# Patient Record
Sex: Female | Born: 2000
Health system: Southern US, Community
[De-identification: ages and names within clinical notes are randomized; demographics above are authoritative.]

## PROBLEM LIST (undated history)

## (undated) DIAGNOSIS — J302 Other seasonal allergic rhinitis: Secondary | ICD-10-CM

---
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
## 2022-05-01 ENCOUNTER — Other Ambulatory Visit: Payer: Self-pay

## 2022-05-01 ENCOUNTER — Encounter (HOSPITAL_COMMUNITY): Payer: Self-pay

## 2022-05-01 ENCOUNTER — Emergency Department (HOSPITAL_COMMUNITY): Payer: Medicaid Other

## 2022-05-01 ENCOUNTER — Emergency Department (HOSPITAL_COMMUNITY)
Admission: EM | Admit: 2022-05-01 | Discharge: 2022-05-02 | Disposition: A | Discharge status: Home / Self Care | Payer: Medicaid Other | Attending: Emergency Medicine | Admitting: Emergency Medicine

## 2022-05-01 DIAGNOSIS — Z20822 Contact with and (suspected) exposure to covid-19: Secondary | ICD-10-CM | POA: Insufficient documentation

## 2022-05-01 DIAGNOSIS — B349 Viral infection, unspecified: Secondary | ICD-10-CM | POA: Diagnosis not present

## 2022-05-01 DIAGNOSIS — R1084 Generalized abdominal pain: Secondary | ICD-10-CM | POA: Diagnosis present

## 2022-05-01 DIAGNOSIS — N9489 Other specified conditions associated with female genital organs and menstrual cycle: Secondary | ICD-10-CM | POA: Diagnosis not present

## 2022-05-01 LAB — COMPREHENSIVE METABOLIC PANEL
ALT: 14 U/L (ref 0–44)
AST: 19 U/L (ref 15–41)
Albumin: 4.1 g/dL (ref 3.5–5.0)
Alkaline Phosphatase: 79 U/L (ref 38–126)
Anion gap: 10 (ref 5–15)
BUN: 15 mg/dL (ref 6–20)
CO2: 20 mmol/L — ABNORMAL LOW (ref 22–32)
Calcium: 9.7 mg/dL (ref 8.9–10.3)
Chloride: 107 mmol/L (ref 98–111)
Creatinine, Ser: 1.04 mg/dL — ABNORMAL HIGH (ref 0.44–1.00)
GFR, Estimated: >60 mL/min (ref >60)
Glucose, Bld: 79 mg/dL (ref 70–99)
Potassium: 4.1 mmol/L (ref 3.5–5.1)
Sodium: 137 mmol/L (ref 135–145)
Total Bilirubin: 0.9 mg/dL (ref 0.3–1.2)
Total Protein: 8.4 g/dL — ABNORMAL HIGH (ref 6.5–8.1)

## 2022-05-01 LAB — URINALYSIS, ROUTINE W REFLEX MICROSCOPIC
Bilirubin Urine: NEGATIVE
Glucose, UA: NEGATIVE mg/dL
Hgb urine dipstick: NEGATIVE
Ketones, ur: 20 mg/dL — AB
Leukocytes,Ua: NEGATIVE
Nitrite: NEGATIVE
Protein, ur: NEGATIVE mg/dL
Specific Gravity, Urine: 1.029 (ref 1.005–1.030)
pH: 5 (ref 5.0–8.0)

## 2022-05-01 LAB — I-STAT BETA HCG BLOOD, ED (MC, WL, AP ONLY): I-stat hCG, quantitative: <5 m[IU]/mL (ref <5)

## 2022-05-01 LAB — CBC
HCT: 41.8 % (ref 36.0–46.0)
Hemoglobin: 13.4 g/dL (ref 12.0–15.0)
MCH: 23.7 pg — ABNORMAL LOW (ref 26.0–34.0)
MCHC: 32.1 g/dL (ref 30.0–36.0)
MCV: 74 fL — ABNORMAL LOW (ref 80.0–100.0)
Platelets: 378 10*3/uL (ref 150–400)
RBC: 5.65 MIL/uL — ABNORMAL HIGH (ref 3.87–5.11)
RDW: 14.6 % (ref 11.5–15.5)
WBC: 8.1 10*3/uL (ref 4.0–10.5)
nRBC: 0 % (ref 0.0–0.2)

## 2022-05-01 LAB — LIPASE, BLOOD: Lipase: 28 U/L (ref 11–51)

## 2022-05-01 LAB — RESP PANEL BY RT-PCR (FLU A&B, COVID) ARPGX2
Influenza A by PCR: NEGATIVE
Influenza B by PCR: NEGATIVE
SARS Coronavirus 2 by RT PCR: NEGATIVE

## 2022-05-01 LAB — GROUP A STREP BY PCR: Group A Strep by PCR: NOT DETECTED

## 2022-05-01 NOTE — ED Provider Triage Note (Signed)
Emergency Medicine Provider Triage Evaluation Note ? ?Madison Foster , a 21 y.o. female  was evaluated in triage.  Pt complains of nausea, vomiting x3 NBNB, fatigue, sore throat and SOB ? ?Her sx started yesterday.  ? ?Review of Systems  ?Positive: See above ?Negative: Fever  ? ?Physical Exam  ?BP (!) 122/92   Pulse 74   Temp 98.1 ?F (36.7 ?C)   Resp 16   SpO2 99%  ?Gen:   Awake, no distress   ?Resp:  Normal effort  ?MSK:   Moves extremities without difficulty  ?Other:  Pleasant well-appearing 21 year old.  In no acute distress.  Sitting comfortably in bed.  Able answer questions appropriately follow commands. No increased work of breathing. Speaking in full sentences.  ?Abd soft NTTP. Lungs w coarse lung sounds ? ?Medical Decision Making  ?Medically screening exam initiated at 8:18 PM.  Appropriate orders placed.  Madison [facility] was informed that the remainder of the evaluation will be completed by another provider, this initial triage assessment does not replace that evaluation, and the importance of remaining in the ED until their evaluation is complete. ? ?CXR  ? ? ?Abd labs and urine ?  ?Gailen Shelter, [place] ?05/01/22 2023 ? ?

## 2022-05-01 NOTE — ED Triage Notes (Signed)
Pt reports yesterday she woke up with a sore throat then started to c.o abd pain, vomitting, sob. ?

## 2022-05-02 MED ORDER — ALBUTEROL SULFATE HFA 108 (90 BASE) MCG/ACT IN AERS
2.0000 | INHALATION_SPRAY | Freq: Once | RESPIRATORY_TRACT | Status: AC
  Administered 2022-05-02: 2 via RESPIRATORY_TRACT
  Filled 2022-05-02: qty 6.7

## 2022-05-02 MED ORDER — DICYCLOMINE HCL 10 MG PO CAPS
10.0000 mg | ORAL_CAPSULE | Freq: Once | ORAL | Status: AC
  Administered 2022-05-02: 10 mg via ORAL
  Filled 2022-05-02: qty 1

## 2022-05-02 MED ORDER — ONDANSETRON 4 MG PO TBDP
4.0000 mg | ORAL_TABLET | Freq: Once | ORAL | Status: AC
  Administered 2022-05-02: 4 mg via ORAL
  Filled 2022-05-02: qty 1

## 2022-05-02 MED ORDER — SODIUM CHLORIDE 0.9 % IV BOLUS
1000.0000 mL | Freq: Once | INTRAVENOUS | Status: AC
  Administered 2022-05-02: 1000 mL via INTRAVENOUS

## 2022-05-02 MED ORDER — DICYCLOMINE HCL 20 MG PO TABS: 20.0000 mg | ORAL_TABLET | Freq: Two times a day (BID) | ORAL | 0 refills | Status: DC

## 2022-05-02 MED ORDER — ONDANSETRON 4 MG PO TBDP: 4.0000 mg | ORAL_TABLET | Freq: Three times a day (TID) | ORAL | 0 refills | Status: DC | PRN

## 2022-05-02 NOTE — ED Provider Notes (Addendum)
?[place] ?Provider Note ? ? ?CSN: [id number] ?Arrival date & time: 05/01/22  1821 ? ?  ? ?History ? ?Chief Complaint  ?Patient presents with  ? Abdominal Pain  ? ? ?Madison Foster is a 21 y.o. female. ? ?HPI ?21 year old female presents to the ER with complaints of sore throat, nausea, vomiting and generalized abdominal pain which started yesterday.  Patient states that symptoms started with a sore throat, today she started to develop nausea, and has had several episodes of nonbloody nonbilious vomiting.  She denies any vaginal bleeding or discharge.  She does endorse alcohol and marijuana use on Saturday 5/6 as this was prom night. She denies any known fevers or chills.  No urinary symptoms.  Denies any flank pain.  Denies any pelvic pain.  She has not taken anything for symptoms.  Also states that she has not developed a cough and feels like she is wheezing.  No history of asthma.  No chest pain.  No shortness of breath. She is sexually active with women but has not had intercourse in the last month  ?  ? ?Home Medications ?Prior to Admission medications   ?Medication Sig Start Date End Date Taking? Authorizing Provider  ?dicyclomine (BENTYL) 20 MG tablet Take 1 tablet (20 mg total) by mouth 2 (two) times daily. 05/02/22  Yes Garald Balding, PA-C  ?ondansetron (ZOFRAN-ODT) 4 MG disintegrating tablet Take 1 tablet (4 mg total) by mouth every 8 (eight) hours as needed for nausea or vomiting. 05/02/22  Yes Garald Balding, PA-C  ?acetaminophen (TYLENOL) 500 MG tablet Take 500-1,000 mg by mouth every 8 (eight) hours as needed for mild pain (or headaches).     [provider]  ?chlorhexidine (PERIDEX) 0.12 % solution Use as directed 15 mLs in the mouth or throat 2 (two) times daily. DO NOT SWALLOW ~ simply swish in your mouth, after you brush your teeth, and then spit the medication/mouth rinse out. ?Patient not taking: Reported on 11/14/2020 11/07/18   Griffin Basil,  NP  ?diphenhydrAMINE (BENADRYL) 25 mg capsule Take 1 capsule (25 mg total) by mouth every 6 (six) hours. ?Patient not taking: Reported on 11/14/2020 05/14/16 11/14/20  Ronny Flurry, MD  ?famotidine (PEPCID) 20 MG tablet Take 1 tablet (20 mg total) by mouth 2 (two) times daily. ?Patient not taking: Reported on 11/14/2020 05/14/16 11/14/20  Ronny Flurry, MD  ?ibuprofen (ADVIL) 400 MG tablet Take 1 tablet (400 mg total) by mouth every 8 (eight) hours as needed for fever, headache, mild pain, moderate pain or cramping. ?Patient not taking: Reported on 11/14/2020 06/01/19   Griffin Basil, NP  ?olopatadine (PATANOL) 0.1 % ophthalmic solution Place 1 drop into both eyes 2 (two) times daily. Take until symptoms resolve. ?Patient not taking: Reported on 11/14/2020 05/14/16   Ronny Flurry, MD  ?polyethylene glycol [facility] / Floria Raveling) packet Take 17 g by mouth daily. ?Patient not taking: Reported on 11/14/2020 08/18/15   Debby Freiberg, MD  ?   ? ?Allergies    ?Shellfish allergy and Other   ? ?Review of Systems   ?Review of Systems ?Ten systems reviewed and are negative for acute change, except as noted in the HPI.  ? ? ?Physical Exam ?Updated Vital Signs ?BP 116/84   Pulse 73   Temp 97.8 ?F (36.6 ?C) (Oral)   Resp 18   SpO2 98%  ?Physical Exam ?Vitals and nursing note reviewed.  ?Constitutional:   ?   General: She is not  in acute distress. ?   Appearance: She is well-developed.  ?HENT:  ?   Head: Normocephalic and atraumatic.  ?   Mouth/Throat:  ?   Comments: Oropharynx non erythematous without exudates, uvula midline, no unilateral tonsillar swelling, tongue normal size and midline, no sublingual/submandibular swellimg, tolerating secretions well  ? ? ? ?Eyes:  ?   Conjunctiva/sclera: Conjunctivae normal.  ?Cardiovascular:  ?   Rate and Rhythm: Normal rate and regular rhythm.  ?   Heart sounds: No murmur heard. ?Pulmonary:  ?   Effort: Pulmonary effort is normal. No respiratory distress.  ?   Breath  sounds: Normal breath sounds.  ?   Comments: Scant wheezes throughout, speaking full sentences, no rales or rhonchi ?Abdominal:  ?   Palpations: Abdomen is soft.  ?   Tenderness: There is no abdominal tenderness. There is no right CVA tenderness or left CVA tenderness.  ?   Comments: Mild generalized abdominal tenderness, no focal tenderness.  No CVA tenderness.  Pelvic pain.  ?Musculoskeletal:     ?   General: No swelling.  ?   Cervical back: Neck supple.  ?Skin: ?   General: Skin is warm and dry.  ?   Capillary Refill: Capillary refill takes less than 2 seconds.  ?Neurological:  ?   Mental Status: She is alert.  ?Psychiatric:     ?   Mood and Affect: Mood normal.  ? ? ?ED Results / Procedures / Treatments   ?Labs ?(all labs ordered are listed, but only abnormal results are displayed) ?Labs Reviewed  ?COMPREHENSIVE METABOLIC PANEL - Abnormal; Notable for the following components:  ?    Result Value  ? CO2 20 (*)   ? Creatinine, Ser 1.04 (*)   ? Total Protein 8.4 (*)   ? All other components within normal limits  ?CBC - Abnormal; Notable for the following components:  ? RBC 5.65 (*)   ? MCV 74.0 (*)   ? MCH 23.7 (*)   ? All other components within normal limits  ?URINALYSIS, ROUTINE W REFLEX MICROSCOPIC - Abnormal; Notable for the following components:  ? Ketones, ur 20 (*)   ? All other components within normal limits  ?RESP PANEL BY RT-PCR (FLU A&B, COVID) ARPGX2  ?GROUP A STREP BY PCR  ?LIPASE, BLOOD  ?I-STAT BETA HCG BLOOD, ED ([facility], [facility], [facility] ONLY)  ? ? ?EKG ?None ? ?Radiology ?DG Chest 2 View ? ?Result Date: 05/01/2022 ?CLINICAL DATA:  Cough, dyspnea EXAM: CHEST - 2 VIEW COMPARISON:  12/09/2017 FINDINGS: The heart size and mediastinal contours are within normal limits. Both lungs are clear. The visualized skeletal structures are unremarkable. IMPRESSION: No active cardiopulmonary disease. Electronically Signed   By: Fidela Salisbury M.D.   On: 05/01/2022 20:54   ? ?Procedures ?Procedures  ? ? ?Medications Ordered in  ED ?Medications  ?albuterol (VENTOLIN HFA) 108 (90 Base) MCG/ACT inhaler 2 puff (2 puffs Inhalation Given 05/02/22 0207)  ?sodium chloride 0.9 % bolus 1,000 mL (1,000 mLs Intravenous New Bag/Given 05/02/22 0206)  ?ondansetron (ZOFRAN-ODT) disintegrating tablet 4 mg (4 mg Oral Given 05/02/22 0157)  ?dicyclomine (BENTYL) capsule 10 mg (10 mg Oral Given 05/02/22 0204)  ? ? ?ED Course/ Medical Decision Making/ A&P ?  ?                        ?Medical Decision Making ?Risk ?Prescription drug management. ? ? ?This patient presents to the ED for concern of sore throat, nausea, vomiting,  abdominal pain this involves a number of treatment options, and is a complaint that carries with it a risk of complications and morbidity.  The differential diagnosis includes viral gastritis, strep throat,  COVID-19, cholecystitis, appendicitis, SBO, divertilculitis, renal stones, pyelonephritis, TOA, ovarian torsion, PID, PE  ? ? ?Co morbidities: ?Discussed in HPI ? ? ?Brief History: ? ?21 year old female presenting to the ER with complaints of sore throat, nausea, vomiting, and abdominal pain x2 days.  She does endorse alcohol and THC use several days ago.  Denies any pelvic pain. ? ?EMR reviewed including pt PMHx, past surgical history and past visits to ER.  ? ?See HPI for more details ? ? ?Lab Tests: ? ?I ordered and independently interpreted labs.  The pertinent results include:   ? ?Labs notable for ?CBC without leukocytosis, normal hemoglobin ?CMP without any significant electrode abnormalities, mild acidosis of 20 with a slightly elevated creatinine of 1.04 likely in the setting of mild dehydration.  ?UA with ketonuria likely again consistent with dehydration.  No evidence of UTI. ?Strep test negative ?COVID-negative ?Lipase negative ?Pregnancy negative ? ?Imaging Studies: ? ?NAD. I personally reviewed all imaging studies and no acute abnormality found. I agree with radiology interpretation. ? ? ? ?Cardiac  Monitoring: ? ?NA ?NA ? ? ?Medicines ordered: ? ?I ordered medication including Bentyl, Zofran, IV fluids and albuterol ?Reevaluation of the patient after these medicines showed that the patient improved ?I have reviewed the patients home

## 2022-05-02 NOTE — Discharge Instructions (Addendum)
You were evaluated in the Emergency Department and after careful evaluation, we did not find any emergent condition requiring admission or further testing in the hospital. ? ?Your work-up today was overall reassuring.  Your COVID and flu test were negative, your strep test was negative.  I do suspect that your symptoms are due to a viral infection.  Please make sure to drink plenty of water.  Use the nausea medication (Zofran) as needed for nausea and Bentyl for abdominal pain. Use albuterol for wheezing ? ?Please return to the Emergency Department if you experience any worsening of your condition.  We encourage you to follow up with a primary care provider.  Thank you for allowing [place] to be a part of your care. ? ?

## 2022-08-30 ENCOUNTER — Encounter (HOSPITAL_COMMUNITY): Payer: Self-pay

## 2022-08-30 ENCOUNTER — Other Ambulatory Visit: Payer: Self-pay

## 2022-08-30 ENCOUNTER — Emergency Department (HOSPITAL_COMMUNITY)
Admission: EM | Admit: 2022-08-30 | Discharge: 2022-08-30 | Disposition: A | Discharge status: Home / Self Care | Payer: Medicaid Other | Attending: Emergency Medicine | Admitting: Emergency Medicine

## 2022-08-30 DIAGNOSIS — B80 Enterobiasis: Secondary | ICD-10-CM | POA: Diagnosis not present

## 2022-08-30 DIAGNOSIS — L290 Pruritus ani: Secondary | ICD-10-CM | POA: Diagnosis present

## 2022-08-30 MED ORDER — ALBENDAZOLE 200 MG PO TABS: 400.0000 mg | ORAL_TABLET | ORAL | 0 refills | Status: AC

## 2022-08-30 NOTE — ED Triage Notes (Signed)
Reports found a worm on arm that was white and also complains of rectal itching at night.

## 2022-08-30 NOTE — Discharge Instructions (Addendum)
Take the albendazole once today then repeat in 2 weeks if still having symptoms.

## 2022-08-30 NOTE — ED Provider Notes (Signed)
[facility] Provider Note   CSN: [id number] Arrival date & time: 08/30/22  1320     History  Chief Complaint  Patient presents with   Anal Itching    Madison Foster is a 21 y.o. female.  HPI   Patient presents due to worms on her body and anal itching.  This started a few days ago, patient found a clear warm on her body has been having itching near the rectum.  Her younger brothers are having similar symptoms at home as well.  Patient recently moved, she denies any fevers or chills.  No history of similar.   Home Medications Prior to Admission medications   Medication Sig Start Date End Date Taking? Authorizing Provider  albendazole (ALBENZA) 200 MG tablet Take 2 tablets (400 mg total) by mouth 1 day or 1 dose for 2 doses. 08/30/22 09/01/22 Yes Theron Arista, PA-C  acetaminophen (TYLENOL) 500 MG tablet Take 500-1,000 mg by mouth every 8 (eight) hours as needed for mild pain (or headaches).     [provider]  chlorhexidine (PERIDEX) 0.12 % solution Use as directed 15 mLs in the mouth or throat 2 (two) times daily. DO NOT SWALLOW ~ simply swish in your mouth, after you brush your teeth, and then spit the medication/mouth rinse out. Patient not taking: Reported on 11/14/2020 11/07/18   Lorin Picket, NP  dicyclomine (BENTYL) 20 MG tablet Take 1 tablet (20 mg total) by mouth 2 (two) times daily. 05/02/22   Mare Ferrari, PA-C  diphenhydrAMINE (BENADRYL) 25 mg capsule Take 1 capsule (25 mg total) by mouth every 6 (six) hours. Patient not taking: Reported on 11/14/2020 05/14/16 11/14/20  Rockney Ghee, MD  famotidine (PEPCID) 20 MG tablet Take 1 tablet (20 mg total) by mouth 2 (two) times daily. Patient not taking: Reported on 11/14/2020 05/14/16 11/14/20  Rockney Ghee, MD  ibuprofen (ADVIL) 400 MG tablet Take 1 tablet (400 mg total) by mouth every 8 (eight) hours as needed for fever, headache, mild pain, moderate pain or  cramping. Patient not taking: Reported on 11/14/2020 06/01/19   Lorin Picket, NP  olopatadine (PATANOL) 0.1 % ophthalmic solution Place 1 drop into both eyes 2 (two) times daily. Take until symptoms resolve. Patient not taking: Reported on 11/14/2020 05/14/16   Rockney Ghee, MD  ondansetron (ZOFRAN-ODT) 4 MG disintegrating tablet Take 1 tablet (4 mg total) by mouth every 8 (eight) hours as needed for nausea or vomiting. 05/02/22   Trudee Grip A, PA-C  polyethylene glycol (MIRALAX / GLYCOLAX) packet Take 17 g by mouth daily. Patient not taking: Reported on 11/14/2020 08/18/15   Mirian Mo, MD      Allergies    Shellfish allergy and Other    Review of Systems   Review of Systems  Physical Exam Updated Vital Signs BP (!) 131/92 (BP Location: Right Arm)   Pulse 85   Temp 98.1 F (36.7 C) (Oral)   Resp 18   Wt 88 kg   SpO2 100%  Physical Exam Vitals and nursing note reviewed. Exam conducted with a chaperone present.  Constitutional:      General: She is not in acute distress.    Appearance: Normal appearance.  HENT:     Head: Normocephalic and atraumatic.  Eyes:     General: No scleral icterus.    Extraocular Movements: Extraocular movements intact.     Pupils: Pupils are equal, round, and reactive to light.  Skin:    Coloration:  Skin is not jaundiced.  Neurological:     Mental Status: She is alert. Mental status is at baseline.     Coordination: Coordination normal.     ED Results / Procedures / Treatments   Labs (all labs ordered are listed, but only abnormal results are displayed) Labs Reviewed - No data to display  EKG None  Radiology No results found.  Procedures Procedures    Medications Ordered in ED Medications - No data to display  ED Course/ Medical Decision Making/ A&P                           Medical Decision Making Risk Prescription drug management.   Patient presents due to worms found on person and anal itching.  We will  treat empirically for enterobiasis with one-time dose of albendazole repeat again in 2 weeks.  Encourage patient to follow-up with PCP for additional management and to return for new concerning symptoms.  Given patient is no abdominal pain, stable vitals and is very well-appearing and on the any additional work-up is needed at this time.        Final Clinical Impression(s) / ED Diagnoses Final diagnoses:  Pinworms    Rx / DC Orders ED Discharge Orders          Ordered    albendazole (ALBENZA) 200 MG tablet  1 Day/Dose        08/30/22 1341              Theron Arista, PA-C 08/30/22 1344    [place], [place] K, DO 08/30/22 1625

## 2022-11-15 IMAGING — CR DG CHEST 2V
2 series · 2 of 2 positions shown · non-contrast
Comparison: 12/09/2017

CLINICAL DATA: Cough, dyspnea

EXAM:
CHEST - 2 VIEW

[chest pa]
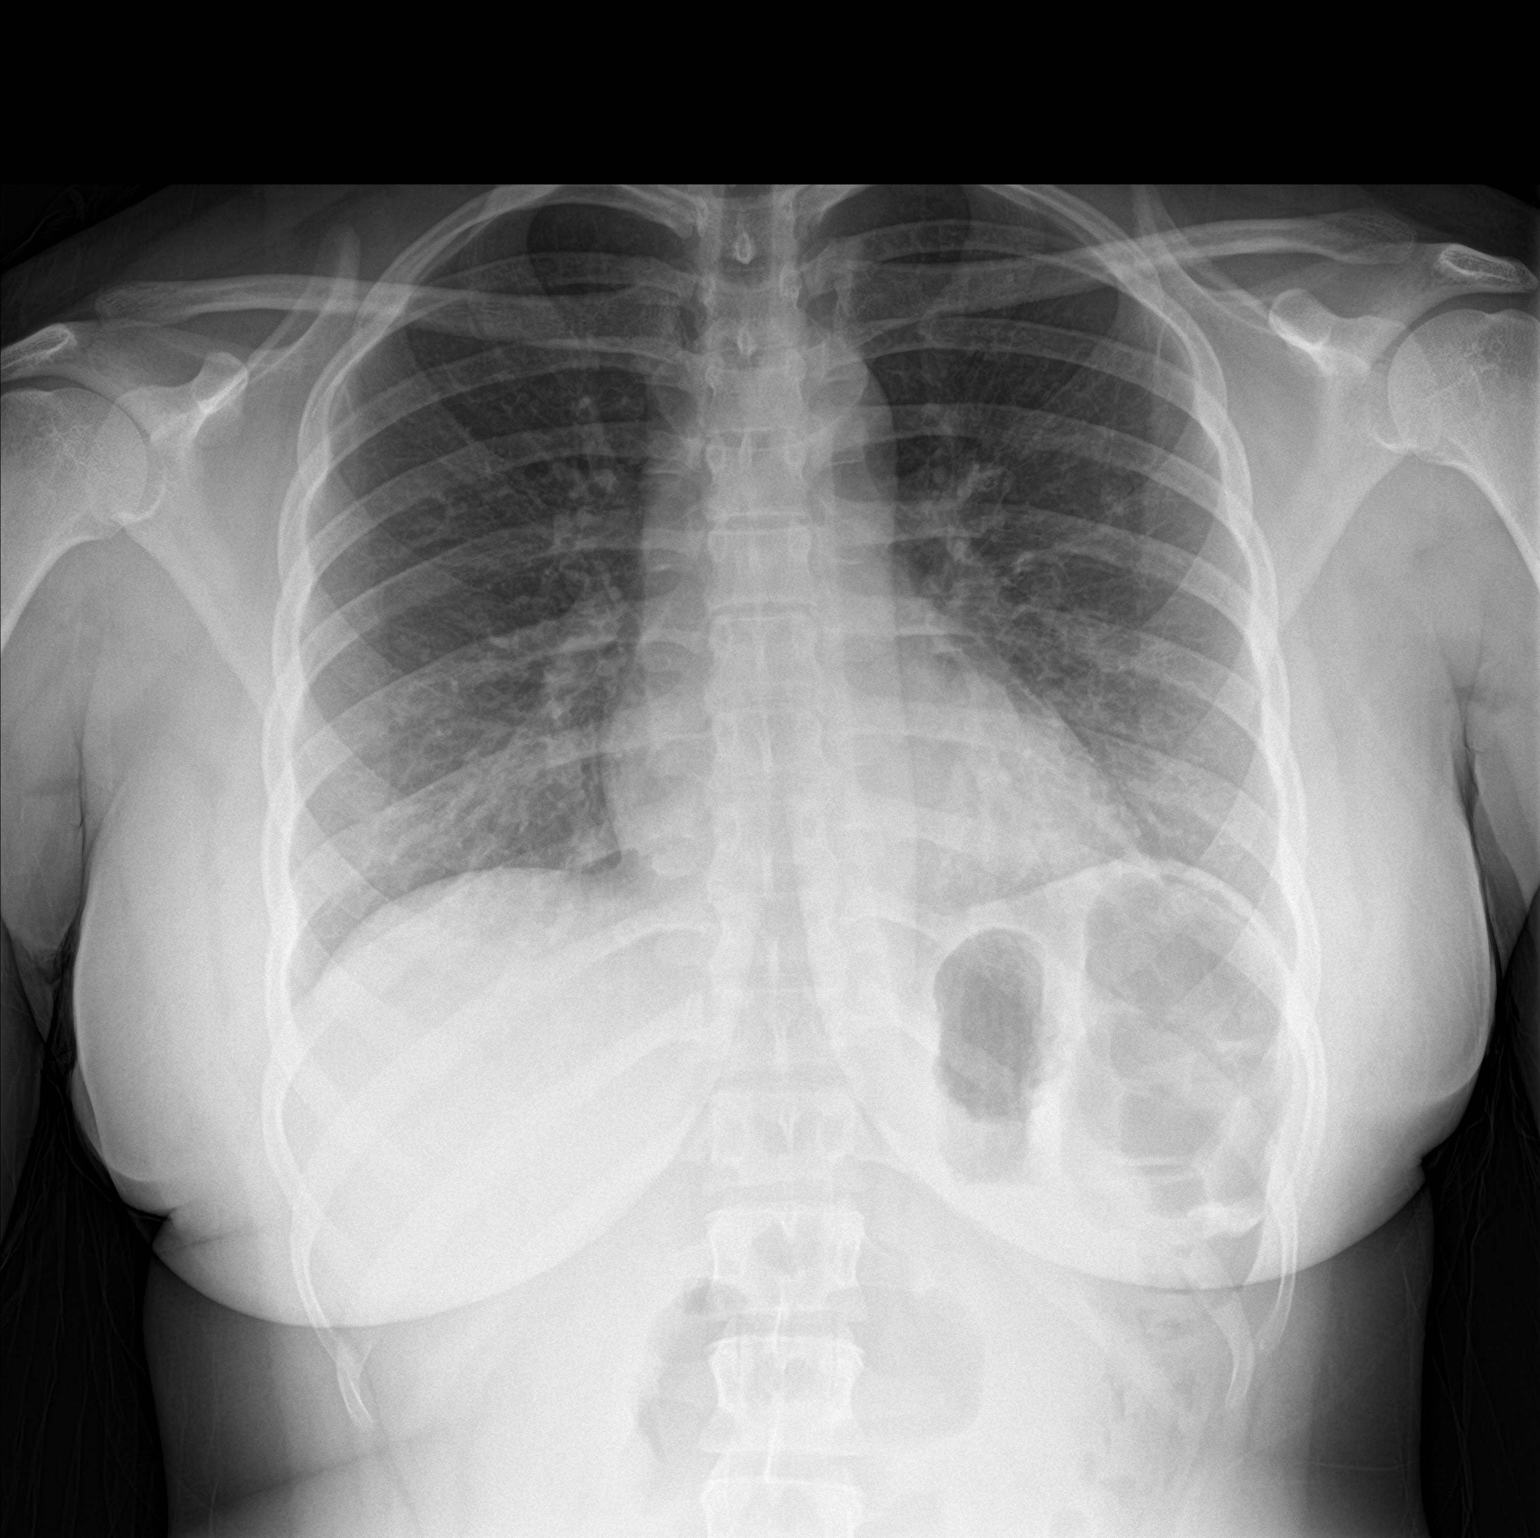

[chest lat]
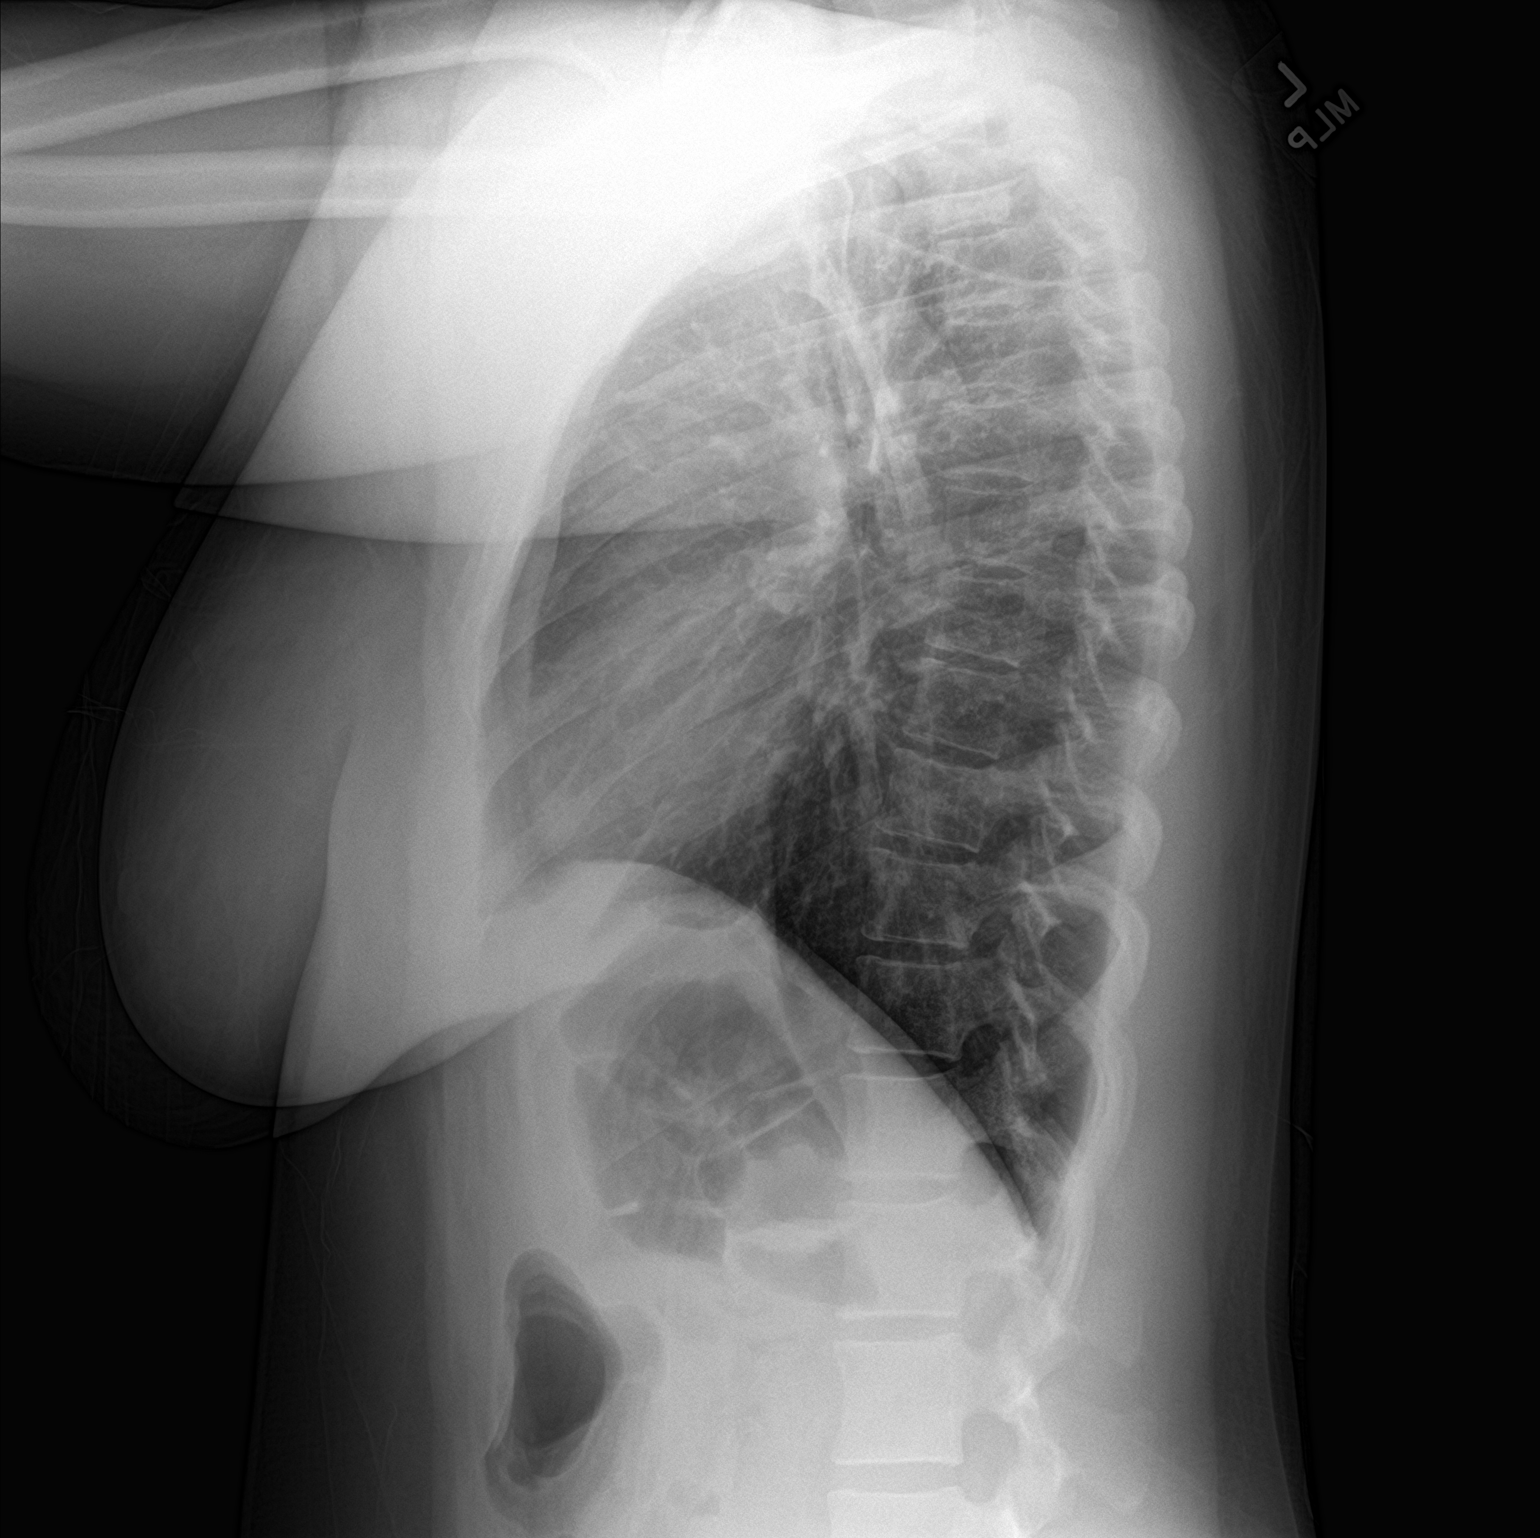

[2 of 2 positions shown; findings below may reference images not displayed]

FINDINGS: The heart size and mediastinal contours are within normal limits.
Both lungs are clear. The visualized skeletal structures are
unremarkable.
IMPRESSION: No active cardiopulmonary disease.

## 2024-01-05 ENCOUNTER — Encounter (HOSPITAL_COMMUNITY): Payer: Self-pay

## 2024-01-05 ENCOUNTER — Emergency Department (HOSPITAL_COMMUNITY)
Admission: EM | Admit: 2024-01-05 | Discharge: 2024-01-06 | Disposition: A | Discharge status: Home / Self Care | Payer: Medicaid Other | Attending: Emergency Medicine | Admitting: Emergency Medicine

## 2024-01-05 ENCOUNTER — Emergency Department (HOSPITAL_COMMUNITY): Payer: Medicaid Other

## 2024-01-05 DIAGNOSIS — J101 Influenza due to other identified influenza virus with other respiratory manifestations: Secondary | ICD-10-CM

## 2024-01-05 DIAGNOSIS — J09X2 Influenza due to identified novel influenza A virus with other respiratory manifestations: Secondary | ICD-10-CM | POA: Insufficient documentation

## 2024-01-05 DIAGNOSIS — R059 Cough, unspecified: Secondary | ICD-10-CM | POA: Diagnosis present

## 2024-01-05 DIAGNOSIS — Z20822 Contact with and (suspected) exposure to covid-19: Secondary | ICD-10-CM | POA: Insufficient documentation

## 2024-01-05 LAB — RESP PANEL BY RT-PCR (RSV, FLU A&B, COVID)  RVPGX2
Influenza A by PCR: POSITIVE — AB
Influenza B by PCR: NEGATIVE
Resp Syncytial Virus by PCR: NEGATIVE
SARS Coronavirus 2 by RT PCR: NEGATIVE

## 2024-01-05 MED ORDER — IBUPROFEN 400 MG PO TABS
600.0000 mg | ORAL_TABLET | Freq: Once | ORAL | Status: AC
  Administered 2024-01-05: 600 mg via ORAL
  Filled 2024-01-05: qty 1

## 2024-01-05 MED ORDER — ONDANSETRON 4 MG PO TBDP: 4.0000 mg | ORAL_TABLET | Freq: Once | ORAL | Status: DC

## 2024-01-05 NOTE — ED Triage Notes (Signed)
 Pt is coming in for cold symptoms that was on going for 2 weeks and states it worsened yesterday. Nausea vomiting, cough, congestion.. All the flu symptoms  Has not been tested for a respiratory infection, and has not seen a doctor.   Medic vitals   126/90 108hr 98%ra 100bgl 99.9 temp

## 2024-01-06 MED ORDER — ONDANSETRON 4 MG PO TBDP: 4.0000 mg | ORAL_TABLET | Freq: Three times a day (TID) | ORAL | 0 refills | Status: DC | PRN

## 2024-01-06 MED ORDER — BENZONATATE 100 MG PO CAPS: 100.0000 mg | ORAL_CAPSULE | Freq: Three times a day (TID) | ORAL | 0 refills | Status: DC | PRN

## 2024-01-06 NOTE — Discharge Instructions (Addendum)
 You have tested positive for the flu today. Take tylenol  for fever and ibuprofen  or Aleve for headaches, body aches. We have provided you a prescription for Zofran  should nausea continue. Drink plenty of fluids to prevent dehydration. Use Tesslon as prescribed for cough management. You may continue to use other over-the-counter remedies for symptom control, if desired. Return for new or concerning symptoms such as worsening shortness of breath, coughing up blood, persistent vomiting, loss of consciousness.

## 2024-01-06 NOTE — ED Provider Notes (Signed)
 [place] EMERGENCY DEPARTMENT AT [facility] Provider Note   CSN: [id number] Arrival date & time: 01/05/24  2113     History  Chief Complaint  Patient presents with   Flu-like symptoms     Madison Foster is a 23 y.o. female.  23 y/o female with no significant PMH presents to the ED for evaluation of flu like symptoms. Notes 2 days of cough, congestion, as well as subjective fever. Patient reporting nausea with sporadic vomiting, difficulty tolerating PO fluids. She denies sick contacts. Last took Tylenol  yesterday for symptoms.  The history is provided by the patient. No language interpreter was used.       Home Medications Prior to Admission medications   Medication Sig Start Date End Date Taking? Authorizing Provider  benzonatate  (TESSALON ) 100 MG capsule Take 1 capsule (100 mg total) by mouth 3 (three) times daily as needed for cough. 01/06/24  Yes Keith Sor, PA-C  ondansetron  (ZOFRAN -ODT) 4 MG disintegrating tablet Take 1 tablet (4 mg total) by mouth every 8 (eight) hours as needed for nausea or vomiting. 01/06/24  Yes Keith Sor, PA-C  acetaminophen  (TYLENOL ) 500 MG tablet Take 500-1,000 mg by mouth every 8 (eight) hours as needed for mild pain (or headaches).     [provider]  dicyclomine  (BENTYL ) 20 MG tablet Take 1 tablet (20 mg total) by mouth 2 (two) times daily. 05/02/22   Vonn Hadassah LABOR, PA-C  ibuprofen  (ADVIL ) 400 MG tablet Take 1 tablet (400 mg total) by mouth every 8 (eight) hours as needed for fever, headache, mild pain, moderate pain or cramping. Patient not taking: Reported on 11/14/2020 06/01/19   Haskins, Kaila R, NP  olopatadine  (PATANOL) 0.1 % ophthalmic solution Place 1 drop into both eyes 2 (two) times daily. Take until symptoms resolve. Patient not taking: Reported on 11/14/2020 05/14/16   Darnell, Elizabeth, MD      Allergies    Shellfish allergy and Other    Review of Systems   Review of Systems Ten systems reviewed and are  negative for acute change, except as noted in the HPI.    Physical Exam Updated Vital Signs BP 116/75 (BP Location: Right Arm)   Pulse (!) 106   Temp (!) 100.5 F (38.1 C)   Resp 18   SpO2 100%   Physical Exam Vitals and nursing note reviewed.  Constitutional:      General: She is not in acute distress.    Appearance: She is well-developed. She is ill-appearing. She is not diaphoretic.     Comments: Ill appearing, but nontoxic.  HENT:     Head: Normocephalic and atraumatic.  Eyes:     General: No scleral icterus.    Conjunctiva/sclera: Conjunctivae normal.  Cardiovascular:     Rate and Rhythm: Regular rhythm. Tachycardia present.     Pulses: Normal pulses.     Comments: Mild tachycardia Pulmonary:     Effort: Pulmonary effort is normal. No respiratory distress.     Comments: Intermittent dry, hacking, nonproductive cough. Abdominal:     General: There is no distension.  Musculoskeletal:        General: Normal range of motion.     Cervical back: Normal range of motion.  Skin:    General: Skin is warm and dry.     Coloration: Skin is not pale.     Findings: No erythema or rash.  Neurological:     Mental Status: She is alert and oriented to person, place, and time.  Coordination: Coordination normal.     Comments: Moving all extremities spontaneously.  Psychiatric:        Behavior: Behavior normal.     ED Results / Procedures / Treatments   Labs (all labs ordered are listed, but only abnormal results are displayed) Labs Reviewed  RESP PANEL BY RT-PCR (RSV, FLU A&B, COVID)  RVPGX2 - Abnormal; Notable for the following components:      Result Value   Influenza A by PCR POSITIVE (*)    All other components within normal limits    EKG None  Radiology DG Chest 2 View Result Date: 01/05/2024 CLINICAL DATA:  Cough and congestion. Fever. Nausea, body aches. Flu like symptoms. EXAM: CHEST - 2 VIEW COMPARISON:  05/01/2022 FINDINGS: The cardiomediastinal contours  are normal. The lungs are clear. Pulmonary vasculature is normal. No consolidation, pleural effusion, or pneumothorax. No acute osseous abnormalities are seen. IMPRESSION: Negative radiographs of the chest. Electronically Signed   By: Andrea Gasman M.D.   On: 01/05/2024 21:43    Procedures Procedures    Medications Ordered in ED Medications  ondansetron  (ZOFRAN -ODT) disintegrating tablet 4 mg (has no administration in time range)  ibuprofen  (ADVIL ) tablet 600 mg (600 mg Oral Given 01/05/24 2317)    ED Course/ Medical Decision Making/ A&P                                 Medical Decision Making Risk Prescription drug management.   This patient presents to the ED for concern of flu-like illness, this involves an extensive number of treatment options, and is a complaint that carries with it a high risk of complications and morbidity.  The differential diagnosis includes COVID vs influenza vs PNA   Co morbidities that complicate the patient evaluation  None known   Lab Tests:  I Ordered, and personally interpreted labs.  The pertinent results include:  Influenza A positive.   Imaging Studies ordered:  I ordered imaging studies including CXR  I independently visualized and interpreted imaging which showed no acute cardiopulmonary abnormality I agree with the radiologist interpretation   Cardiac Monitoring:  The patient was maintained on a cardiac monitor.  I personally viewed and interpreted the cardiac monitored which showed an underlying rhythm of: sinus tachycardia, mild   Medicines ordered and prescription drug management:  I ordered medication including ibuprofen  for fever, Zofran  for nausea  Reevaluation of the patient after these medicines showed that the patient improved I have reviewed the patients home medicines and have made adjustments as needed   Problem List / ED Course:  Patient with symptoms consistent with influenza.  Vitals are stable, low-grade  fever.  No signs of dehydration, tolerating PO's after Zofran .  CXR negative for PNA or other acute cardiopulmonary abnormality. No tachypnea, dyspnea, hypoxemia while in the ED. Patient will be discharged with instructions to orally hydrate, rest, and use over-the-counter medications such as anti-inflammatories ibuprofen  and Aleve for muscle aches and Tylenol  for fever.  Patient will also be given a cough suppressant.    Reevaluation:  After the interventions noted above, I reevaluated the patient and found that they have :stayed the same   Social Determinants of Health:  Lives independently    Dispostion:  After consideration of the diagnostic results and the patients response to treatment, I feel that the patent would benefit from supportive care measures, oral hydration. Return precautions discussed and provided. Patient discharged in stable condition  with no unaddressed concerns.          Final Clinical Impression(s) / ED Diagnoses Final diagnoses:  Influenza A    Rx / DC Orders ED Discharge Orders          Ordered    benzonatate  (TESSALON ) 100 MG capsule  3 times daily PRN        01/06/24 0031    ondansetron  (ZOFRAN -ODT) 4 MG disintegrating tablet  Every 8 hours PRN        01/06/24 0031              Keith Sor, PA-C 01/06/24 0114    Theadore Ozell HERO, MD 01/06/24 [phone]

## 2024-06-06 ENCOUNTER — Emergency Department (HOSPITAL_COMMUNITY)
Admission: EM | Admit: 2024-06-06 | Discharge: 2024-06-06 | Disposition: A | Discharge status: Home / Self Care | Attending: Emergency Medicine | Admitting: Emergency Medicine

## 2024-06-06 ENCOUNTER — Encounter (HOSPITAL_COMMUNITY): Payer: Self-pay

## 2024-06-06 ENCOUNTER — Other Ambulatory Visit: Payer: Self-pay

## 2024-06-06 ENCOUNTER — Emergency Department (HOSPITAL_COMMUNITY)

## 2024-06-06 DIAGNOSIS — R072 Precordial pain: Secondary | ICD-10-CM | POA: Insufficient documentation

## 2024-06-06 DIAGNOSIS — R0602 Shortness of breath: Secondary | ICD-10-CM | POA: Insufficient documentation

## 2024-06-06 DIAGNOSIS — R079 Chest pain, unspecified: Secondary | ICD-10-CM | POA: Diagnosis present

## 2024-06-06 LAB — HEPATIC FUNCTION PANEL
ALT: 9 U/L (ref 0–44)
AST: 21 U/L (ref 15–41)
Albumin: 3.5 g/dL (ref 3.5–5.0)
Alkaline Phosphatase: 41 U/L (ref 38–126)
Bilirubin, Direct: <0.1 mg/dL (ref 0.0–0.2)
Total Bilirubin: 0.6 mg/dL (ref 0.0–1.2)
Total Protein: 6.9 g/dL (ref 6.5–8.1)

## 2024-06-06 LAB — CBC
HCT: 34.7 % — ABNORMAL LOW (ref 36.0–46.0)
Hemoglobin: 11.1 g/dL — ABNORMAL LOW (ref 12.0–15.0)
MCH: 23.5 pg — ABNORMAL LOW (ref 26.0–34.0)
MCHC: 32 g/dL (ref 30.0–36.0)
MCV: 73.5 fL — ABNORMAL LOW (ref 80.0–100.0)
Platelets: 408 10*3/uL — ABNORMAL HIGH (ref 150–400)
RBC: 4.72 MIL/uL (ref 3.87–5.11)
RDW: 14.4 % (ref 11.5–15.5)
WBC: 7 10*3/uL (ref 4.0–10.5)
nRBC: 0 % (ref 0.0–0.2)

## 2024-06-06 LAB — BASIC METABOLIC PANEL WITH GFR
Anion gap: 11 (ref 5–15)
BUN: 10 mg/dL (ref 6–20)
CO2: 14 mmol/L — ABNORMAL LOW (ref 22–32)
Calcium: 9.1 mg/dL (ref 8.9–10.3)
Chloride: 110 mmol/L (ref 98–111)
Creatinine, Ser: 0.81 mg/dL (ref 0.44–1.00)
GFR, Estimated: >60 mL/min (ref >60)
Glucose, Bld: 92 mg/dL (ref 70–99)
Potassium: 3.2 mmol/L — ABNORMAL LOW (ref 3.5–5.1)
Sodium: 135 mmol/L (ref 135–145)

## 2024-06-06 LAB — TROPONIN I (HIGH SENSITIVITY)
Troponin I (High Sensitivity): <2 ng/L (ref <18)
Troponin I (High Sensitivity): <2 ng/L (ref <18)

## 2024-06-06 LAB — LIPASE, BLOOD: Lipase: 27 U/L (ref 11–51)

## 2024-06-06 LAB — HCG, SERUM, QUALITATIVE: Preg, Serum: NEGATIVE

## 2024-06-06 LAB — D-DIMER, QUANTITATIVE: D-Dimer, Quant: 0.33 ug{FEU}/mL (ref 0.00–0.50)

## 2024-06-06 MED ORDER — ALUM & MAG HYDROXIDE-SIMETH 200-200-20 MG/5ML PO SUSP
30.0000 mL | Freq: Once | ORAL | Status: AC
Start: 2024-06-06 — End: 2024-06-06
  Administered 2024-06-06: 30 mL via ORAL
  Filled 2024-06-06: qty 30

## 2024-06-06 MED ORDER — HYDROXYZINE HCL 25 MG PO TABS: 25.0000 mg | ORAL_TABLET | Freq: Four times a day (QID) | ORAL | 0 refills | Status: DC | PRN

## 2024-06-06 MED ORDER — LORAZEPAM 1 MG PO TABS
1.0000 mg | ORAL_TABLET | Freq: Once | ORAL | Status: AC
  Administered 2024-06-06: 1 mg via ORAL
  Filled 2024-06-06: qty 1

## 2024-06-06 NOTE — Discharge Instructions (Signed)

## 2024-06-06 NOTE — ED Triage Notes (Signed)
 Pt BIB [facility] from home d/t intermittent SOB/central CP the past 2 days that became suddenly worse while walking to the bus stop causing her to have a panic attack right before she called EMS. EMS reports BP was 152/86 & after she was given 324 ASA & 0.4 nitroglycerin BP was 110/84. 95 bpm, 20 resp, was given 1 DuoNeb d/t some Rt sided expiratory wheezing & O2 during that was 100%. A/Ox4, No Hx of cardiac issues, does have Hx on panic attacks. EKG unremarkable. No improvement of CP post nitroglycerin.

## 2024-06-06 NOTE — ED Notes (Signed)
 Paper work reviewed with pt. Pt is in no new onset distress at this time. Pt is leaving for lobby with father for home.

## 2024-06-06 NOTE — ED Provider Notes (Signed)
 Emergency Department Provider Note   I have reviewed the triage vital signs and the nursing notes.   HISTORY  Chief Complaint Chest Pain and Shortness of Breath   HPI Madison Foster is a 23 y.o. female with past medical history of seasonal allergies presents emergency department with intermittent chest pain over the past couple of days.  Symptoms are overall worsening.  Patient describes central chest tightness/pressure which began at work yesterday.  Symptoms seem to improve but then worsened today while walking to the bus to go to work.  She self reports feeling like she was having a panic attack with some associated shortness of breath.  No history of this in the past.  She does not take medication for anxiety.  Denies any sharp/pleuritic pain.  She does not take hormonal contraception medications.  EMS arrived and gave aspirin along with nitroglycerin with minimal reduction in pain.  She states currently intensity is 7 out of 10.   Past Medical History:  Diagnosis Date   Seasonal allergies     Review of Systems  Constitutional: No fever/chills Cardiovascular: Positive chest pain. Respiratory: Positive shortness of breath. Gastrointestinal: No abdominal pain.  No nausea, no vomiting.  Skin: Negative for rash. Neurological: Negative for headaches.  ____________________________________________   PHYSICAL EXAM:  VITAL SIGNS: ED Triage Vitals  Encounter Vitals Group     BP 06/06/24 1036 123/78     Pulse Rate 06/06/24 1036 78     Resp 06/06/24 1036 (!) 22     Temp 06/06/24 1036 97.7 F (36.5 C)     Temp Source 06/06/24 1036 Oral     SpO2 06/06/24 1036 100 %     Weight 06/06/24 1043 175 lb (79.4 kg)     Height 06/06/24 1043 5' 7 (1.702 m)   Constitutional: Alert and oriented. Well appearing and in no acute distress. Eyes: Conjunctivae are normal.  Head: Atraumatic. Nose: No congestion/rhinnorhea. Mouth/Throat: Mucous membranes are moist.  Neck: No stridor.   Cardiovascular: Normal rate, regular rhythm. Good peripheral circulation. Grossly normal heart sounds.   Respiratory: Normal respiratory effort.  No retractions. Lungs CTAB. Gastrointestinal: Soft and nontender. No distention.  Musculoskeletal: No lower extremity tenderness nor edema. No gross deformities of extremities. Neurologic:  Normal speech and language. No gross focal neurologic deficits are appreciated.  Skin:  Skin is warm, dry and intact. No rash noted.  ____________________________________________   LABS (all labs ordered are listed, but only abnormal results are displayed)  Labs Reviewed  BASIC METABOLIC PANEL WITH GFR - Abnormal; Notable for the following components:      Result Value   Potassium 3.2 (*)    CO2 14 (*)    All other components within normal limits  CBC - Abnormal; Notable for the following components:   Hemoglobin 11.1 (*)    HCT 34.7 (*)    MCV 73.5 (*)    MCH 23.5 (*)    Platelets 408 (*)    All other components within normal limits  HCG, SERUM, QUALITATIVE  LIPASE, BLOOD  HEPATIC FUNCTION PANEL  D-DIMER, QUANTITATIVE  TROPONIN I (HIGH SENSITIVITY)  TROPONIN I (HIGH SENSITIVITY)   ____________________________________________  EKG   EKG Interpretation Date/Time:  Saturday June 06 2024 11:34:21 EDT Ventricular Rate:  74 PR Interval:  183 QRS Duration:  89 QT Interval:  386 QTC Calculation: 429 R Axis:   73  Text Interpretation: Sinus rhythm Borderline T wave abnormalities Similar to 2021 tracing No STEMI Confirmed by Abby Hocking [phone])  on 06/06/2024 11:37:03 AM        ____________________________________________  RADIOLOGY  DG Chest 2 View Result Date: 06/06/2024 CLINICAL DATA:  23 year old female with shortness of breath and chest pain. EXAM: CHEST - 2 VIEW COMPARISON:  Chest radiographs 11/04/2024 and earlier. FINDINGS: Semi upright AP and lateral views 1106 hours. Lung volumes and mediastinal contours are stable and normal.  Visualized tracheal air column is within normal limits. Both lungs appear stable and clear. No pneumothorax or pleural effusion. No acute osseous abnormality identified. Negative visible bowel gas. IMPRESSION: Negative.  No cardiopulmonary abnormality. Electronically Signed   By: Marlise Simpers M.D.   On: 06/06/2024 11:45    ____________________________________________   PROCEDURES  Procedure(s) performed:   Procedures  None  ____________________________________________   INITIAL IMPRESSION / ASSESSMENT AND PLAN / ED COURSE  Pertinent labs & imaging results that were available during my care of the patient were reviewed by me and considered in my medical decision making (see chart for details).   This patient is Presenting for Evaluation of CP, which does require a range of treatment options, and is a complaint that involves a high risk of morbidity and mortality.  The Differential Diagnoses Differential includes all life-threatening causes for chest pain. This includes but is not exclusive to acute coronary syndrome, aortic dissection, pulmonary embolism, cardiac tamponade, community-acquired pneumonia, pericarditis, musculoskeletal chest wall pain, etc.   Critical Interventions-    Medications  LORazepam (ATIVAN) tablet 1 mg (1 mg Oral Given 06/06/24 1127)  alum & mag hydroxide-simeth (MAALOX/MYLANTA) 200-200-20 MG/5ML suspension 30 mL (30 mLs Oral Given 06/06/24 1127)    Reassessment after intervention:  symptoms resolved.   Clinical Laboratory Tests Ordered, included troponin negative x 2.  Negative D-dimer.  LFTs and lipase normal.  Minimally low hemoglobin at 11.1.  No acute kidney injury.  Pregnancy negative.  Radiologic Tests Ordered, included CXR. I independently interpreted the images and agree with radiology interpretation.   Cardiac Monitor Tracing which shows NSR.    Social Determinants of Health Risk patient is not an active smoker.    Medical Decision Making:  Summary:  Patient presents to the ED with CP.  Seems fairly atypical.  Patient is low risk for ACS and PE.  Will obtain a workup in the ED for these given multiple days of progressively worsening symptoms.  No abdominal tenderness.  Reevaluation with update and discussion with patient.  Lab work is reassuring.  Plan for discharge with small number of Atarax to take as needed for anxiety type symptoms.  We discussed that I am not sure her symptoms are related to anxiety.  Advise close PCP follow-up and strict ED return precautions should symptoms worsen or return.  Considered admission but ED workup is reassuring.   Patient's presentation is most consistent with acute presentation with potential threat to life or bodily function.   Disposition: discharge  ____________________________________________  FINAL CLINICAL IMPRESSION(S) / ED DIAGNOSES  Final diagnoses:  Precordial chest pain     NEW OUTPATIENT MEDICATIONS STARTED DURING THIS VISIT:  New Prescriptions   HYDROXYZINE (ATARAX) 25 MG TABLET    Take 1 tablet (25 mg total) by mouth every 6 (six) hours as needed for anxiety.    Note:  This document was prepared using Dragon voice recognition software and may include unintentional dictation errors.  Abby Hocking, MD, [facility]    Declyn Offield, Shereen Dike, MD 06/06/24 [phone]

## 2024-06-06 NOTE — ED Notes (Signed)
 Got patient on the monitor did EKG shown to Dr Dolan Freiberg

## 2024-12-25 ENCOUNTER — Encounter (HOSPITAL_COMMUNITY)
Admission: EM | Disposition: A | Discharge status: Home / Self Care | Payer: Self-pay | Source: Home / Self Care | Attending: Emergency Medicine

## 2024-12-25 ENCOUNTER — Emergency Department (HOSPITAL_COMMUNITY): Admitting: Anesthesiology

## 2024-12-25 ENCOUNTER — Emergency Department (HOSPITAL_COMMUNITY)

## 2024-12-25 ENCOUNTER — Other Ambulatory Visit: Payer: Self-pay

## 2024-12-25 ENCOUNTER — Ambulatory Visit (HOSPITAL_COMMUNITY)
Admission: EM | Admit: 2024-12-25 | Discharge: 2024-12-25 | Disposition: A | Discharge status: Home / Self Care | Attending: Emergency Medicine | Admitting: Emergency Medicine

## 2024-12-25 ENCOUNTER — Encounter (HOSPITAL_COMMUNITY): Payer: Self-pay

## 2024-12-25 DIAGNOSIS — R1110 Vomiting, unspecified: Secondary | ICD-10-CM

## 2024-12-25 DIAGNOSIS — K8010 Calculus of gallbladder with chronic cholecystitis without obstruction: Secondary | ICD-10-CM | POA: Diagnosis present

## 2024-12-25 DIAGNOSIS — K819 Cholecystitis, unspecified: Secondary | ICD-10-CM | POA: Diagnosis not present

## 2024-12-25 DIAGNOSIS — K8020 Calculus of gallbladder without cholecystitis without obstruction: Secondary | ICD-10-CM

## 2024-12-25 HISTORY — PX: INDOCYANINE GREEN FLUORESCENCE IMAGING (ICG): SHX7595

## 2024-12-25 HISTORY — PX: CHOLECYSTECTOMY: SHX55

## 2024-12-25 LAB — URINALYSIS, ROUTINE W REFLEX MICROSCOPIC
Bilirubin Urine: NEGATIVE
Glucose, UA: NEGATIVE mg/dL
Ketones, ur: NEGATIVE mg/dL
Nitrite: NEGATIVE
Protein, ur: NEGATIVE mg/dL
Specific Gravity, Urine: 1.024 (ref 1.005–1.030)
pH: 5 (ref 5.0–8.0)

## 2024-12-25 LAB — COMPREHENSIVE METABOLIC PANEL WITH GFR
ALT: 15 U/L (ref 0–44)
AST: 22 U/L (ref 15–41)
Albumin: 4.4 g/dL (ref 3.5–5.0)
Alkaline Phosphatase: 46 U/L (ref 38–126)
Anion gap: 11 (ref 5–15)
BUN: 11 mg/dL (ref 6–20)
CO2: 21 mmol/L — ABNORMAL LOW (ref 22–32)
Calcium: 9.4 mg/dL (ref 8.9–10.3)
Chloride: 106 mmol/L (ref 98–111)
Creatinine, Ser: 0.9 mg/dL (ref 0.44–1.00)
GFR, Estimated: >60 mL/min
Glucose, Bld: 94 mg/dL (ref 70–99)
Potassium: 4.1 mmol/L (ref 3.5–5.1)
Sodium: 138 mmol/L (ref 135–145)
Total Bilirubin: 0.3 mg/dL (ref 0.0–1.2)
Total Protein: 7.4 g/dL (ref 6.5–8.1)

## 2024-12-25 LAB — CBC
HCT: 34.5 % — ABNORMAL LOW (ref 36.0–46.0)
Hemoglobin: 11.2 g/dL — ABNORMAL LOW (ref 12.0–15.0)
MCH: 24.1 pg — ABNORMAL LOW (ref 26.0–34.0)
MCHC: 32.5 g/dL (ref 30.0–36.0)
MCV: 74.4 fL — ABNORMAL LOW (ref 80.0–100.0)
Platelets: 345 K/uL (ref 150–400)
RBC: 4.64 MIL/uL (ref 3.87–5.11)
RDW: 14.5 % (ref 11.5–15.5)
WBC: 5.1 K/uL (ref 4.0–10.5)
nRBC: 0 % (ref 0.0–0.2)

## 2024-12-25 LAB — HCG, SERUM, QUALITATIVE: Preg, Serum: NEGATIVE

## 2024-12-25 LAB — LIPASE, BLOOD: Lipase: 30 U/L (ref 11–51)

## 2024-12-25 MED ORDER — FENTANYL CITRATE (PF) 100 MCG/2ML IJ SOLN
INTRAMUSCULAR | Status: DC | PRN
  Administered 2024-12-25 (×2): 50 ug via INTRAVENOUS

## 2024-12-25 MED ORDER — LACTATED RINGERS IV SOLN: INTRAVENOUS | Status: DC

## 2024-12-25 MED ORDER — ACETAMINOPHEN 500 MG PO TABS
1000.0000 mg | ORAL_TABLET | Freq: Once | ORAL | Status: AC
  Administered 2024-12-25: 1000 mg via ORAL

## 2024-12-25 MED ORDER — INDOCYANINE GREEN 25 MG IJ SOLR
1.2500 mg | INTRAMUSCULAR | Status: AC
  Administered 2024-12-25: 1.25 mg via INTRAVENOUS

## 2024-12-25 MED ORDER — DEXMEDETOMIDINE HCL IN NACL 80 MCG/20ML IV SOLN
INTRAVENOUS | Status: AC
  Filled 2024-12-25: qty 20

## 2024-12-25 MED ORDER — ONDANSETRON HCL 4 MG/2ML IJ SOLN
4.0000 mg | Freq: Once | INTRAMUSCULAR | Status: AC
  Administered 2024-12-25: 4 mg via INTRAVENOUS
  Filled 2024-12-25: qty 2

## 2024-12-25 MED ORDER — SODIUM CHLORIDE 0.9 % IR SOLN
Status: DC | PRN
  Administered 2024-12-25: 1000 mL

## 2024-12-25 MED ORDER — MIDAZOLAM HCL 2 MG/2ML IJ SOLN
INTRAMUSCULAR | Status: AC
  Filled 2024-12-25: qty 2

## 2024-12-25 MED ORDER — SUCCINYLCHOLINE CHLORIDE 200 MG/10ML IV SOSY
PREFILLED_SYRINGE | INTRAVENOUS | Status: DC | PRN
  Administered 2024-12-25: 100 mg via INTRAVENOUS

## 2024-12-25 MED ORDER — ROCURONIUM BROMIDE 10 MG/ML (PF) SYRINGE
PREFILLED_SYRINGE | INTRAVENOUS | Status: DC | PRN
  Administered 2024-12-25: 50 mg via INTRAVENOUS

## 2024-12-25 MED ORDER — CHLORHEXIDINE GLUCONATE 0.12 % MT SOLN
15.0000 mL | Freq: Once | OROMUCOSAL | Status: AC
  Administered 2024-12-25: 15 mL via OROMUCOSAL
  Filled 2024-12-25: qty 15

## 2024-12-25 MED ORDER — TRAMADOL HCL 50 MG PO TABS: 50.0000 mg | ORAL_TABLET | Freq: Four times a day (QID) | ORAL | 0 refills | Status: DC | PRN

## 2024-12-25 MED ORDER — MORPHINE SULFATE (PF) 4 MG/ML IV SOLN
4.0000 mg | Freq: Once | INTRAVENOUS | Status: AC
  Administered 2024-12-25: 4 mg via INTRAVENOUS
  Filled 2024-12-25: qty 1

## 2024-12-25 MED ORDER — LIDOCAINE 2% (20 MG/ML) 5 ML SYRINGE
INTRAMUSCULAR | Status: DC | PRN
  Administered 2024-12-25: 80 mg via INTRAVENOUS

## 2024-12-25 MED ORDER — 0.9 % SODIUM CHLORIDE (POUR BTL) OPTIME
TOPICAL | Status: DC | PRN
  Administered 2024-12-25: 1000 mL

## 2024-12-25 MED ORDER — BUPIVACAINE-EPINEPHRINE 0.25% -1:200000 IJ SOLN
INTRAMUSCULAR | Status: DC | PRN
  Administered 2024-12-25: 22 mL

## 2024-12-25 MED ORDER — OXYCODONE-ACETAMINOPHEN 5-325 MG PO TABS: 1.0000 | ORAL_TABLET | Freq: Once | ORAL | Status: DC

## 2024-12-25 MED ORDER — TRAMADOL HCL 50 MG PO TABS: 50.0000 mg | ORAL_TABLET | Freq: Four times a day (QID) | ORAL | 0 refills | Status: AC | PRN

## 2024-12-25 MED ORDER — ACETAMINOPHEN 500 MG PO TABS
1000.0000 mg | ORAL_TABLET | ORAL | Status: DC
  Filled 2024-12-25: qty 2

## 2024-12-25 MED ORDER — DEXAMETHASONE SOD PHOSPHATE PF 10 MG/ML IJ SOLN
INTRAMUSCULAR | Status: DC | PRN
  Administered 2024-12-25: 10 mg via INTRAVENOUS

## 2024-12-25 MED ORDER — SODIUM CHLORIDE 0.9 % IV BOLUS
1000.0000 mL | Freq: Once | INTRAVENOUS | Status: AC
  Administered 2024-12-25: 1000 mL via INTRAVENOUS

## 2024-12-25 MED ORDER — MIDAZOLAM HCL (PF) 2 MG/2ML IJ SOLN
INTRAMUSCULAR | Status: DC | PRN
  Administered 2024-12-25 (×2): 1 mg via INTRAVENOUS

## 2024-12-25 MED ORDER — FENTANYL CITRATE (PF) 100 MCG/2ML IJ SOLN
INTRAMUSCULAR | Status: AC
  Filled 2024-12-25: qty 2

## 2024-12-25 MED ORDER — ONDANSETRON 4 MG PO TBDP
4.0000 mg | ORAL_TABLET | Freq: Once | ORAL | Status: AC | PRN
  Administered 2024-12-25: 4 mg via ORAL
  Filled 2024-12-25: qty 1

## 2024-12-25 MED ORDER — ONDANSETRON HCL 4 MG/2ML IJ SOLN
INTRAMUSCULAR | Status: DC | PRN
  Administered 2024-12-25: 4 mg via INTRAVENOUS

## 2024-12-25 MED ORDER — FENTANYL CITRATE (PF) 250 MCG/5ML IJ SOLN: INTRAMUSCULAR | Status: DC | PRN

## 2024-12-25 MED ORDER — SCOPOLAMINE 1 MG/3DAYS TD PT72
1.0000 | MEDICATED_PATCH | TRANSDERMAL | Status: DC
  Administered 2024-12-25: 1 mg via TRANSDERMAL
  Filled 2024-12-25: qty 1

## 2024-12-25 MED ORDER — PROPOFOL 10 MG/ML IV BOLUS
INTRAVENOUS | Status: AC
  Filled 2024-12-25: qty 20

## 2024-12-25 MED ORDER — PROPOFOL 500 MG/50ML IV EMUL
INTRAVENOUS | Status: DC | PRN
  Administered 2024-12-25: 150 ug/kg/min via INTRAVENOUS

## 2024-12-25 MED ORDER — BUPIVACAINE-EPINEPHRINE (PF) 0.25% -1:200000 IJ SOLN
INTRAMUSCULAR | Status: AC
  Filled 2024-12-25: qty 30

## 2024-12-25 MED ORDER — FENTANYL CITRATE (PF) 50 MCG/ML IJ SOSY
50.0000 ug | PREFILLED_SYRINGE | Freq: Once | INTRAMUSCULAR | Status: AC
  Administered 2024-12-25: 50 ug via INTRAVENOUS
  Filled 2024-12-25: qty 1

## 2024-12-25 MED ORDER — ORAL CARE MOUTH RINSE: 15.0000 mL | Freq: Once | OROMUCOSAL | Status: AC

## 2024-12-25 MED ORDER — DEXMEDETOMIDINE HCL IN NACL 80 MCG/20ML IV SOLN
INTRAVENOUS | Status: DC | PRN
  Administered 2024-12-25: 8 ug via INTRAVENOUS
  Administered 2024-12-25: 4 ug via INTRAVENOUS

## 2024-12-25 MED ORDER — SUGAMMADEX SODIUM 200 MG/2ML IV SOLN
INTRAVENOUS | Status: DC | PRN
  Administered 2024-12-25: 200 mg via INTRAVENOUS

## 2024-12-25 MED ORDER — HYDRALAZINE HCL 20 MG/ML IJ SOLN
INTRAMUSCULAR | Status: DC | PRN
  Administered 2024-12-25: 5 mg via INTRAVENOUS

## 2024-12-25 MED ORDER — IBUPROFEN 200 MG PO TABS: 600.0000 mg | ORAL_TABLET | Freq: Three times a day (TID) | ORAL | Status: AC | PRN

## 2024-12-25 MED ORDER — IBUPROFEN 400 MG PO TABS
600.0000 mg | ORAL_TABLET | ORAL | Status: AC
  Administered 2024-12-25: 600 mg via ORAL
  Filled 2024-12-25: qty 1

## 2024-12-25 MED ORDER — SODIUM CHLORIDE 0.9 % IV SOLN
2.0000 g | Freq: Once | INTRAVENOUS | Status: AC
  Administered 2024-12-25: 2 g via INTRAVENOUS
  Filled 2024-12-25: qty 20

## 2024-12-25 MED ORDER — ACETAMINOPHEN 500 MG PO TABS: 1000.0000 mg | ORAL_TABLET | Freq: Four times a day (QID) | ORAL | Status: AC | PRN

## 2024-12-25 MED ORDER — PROPOFOL 10 MG/ML IV BOLUS
INTRAVENOUS | Status: DC | PRN
  Administered 2024-12-25: 140 mg via INTRAVENOUS

## 2024-12-25 MED ORDER — HYDRALAZINE HCL 20 MG/ML IJ SOLN
INTRAMUSCULAR | Status: AC
  Filled 2024-12-25: qty 1

## 2024-12-25 NOTE — ED Provider Triage Note (Signed)
 Emergency Medicine Provider Triage Evaluation Note  Madison Foster , a 24 y.o. female  was evaluated in triage.  Pt complains of right upper quadrant abdominal pain with nausea and vomiting, worse after eating which began on the first.  Review of Systems  Positive:  Negative:   Physical Exam  BP (!) 141/102 (BP Location: Right Arm)   Pulse 76   Temp 98 F (36.7 C)   Resp 19   Ht 5' 7 (1.702 m)   Wt 80 kg   SpO2 100%   BMI 27.62 kg/m  Gen:   Awake, no distress   Resp:  Normal effort  MSK:   Moves extremities without difficulty  Other:  Tenderness to palpation right upper quadrant  Medical Decision Making  Medically screening exam initiated at 3:53 AM.  Appropriate orders placed.  Madison Foster was informed that the remainder of the evaluation will be completed by another provider, this initial triage assessment does not replace that evaluation, and the importance of remaining in the ED until their evaluation is complete.     Madison Foster, [place] 12/25/24 [phone]

## 2024-12-25 NOTE — Anesthesia Postprocedure Evaluation (Signed)
"   Anesthesia Post Note  Patient: Aadya Kindler  Procedure(s) Performed: LAPAROSCOPIC CHOLECYSTECTOMY (Abdomen) INDOCYANINE GREEN FLUORESCENCE IMAGING (ICG) (Abdomen)     Patient location during evaluation: PACU Anesthesia Type: General Level of consciousness: awake and alert Pain management: pain level controlled Vital Signs Assessment: post-procedure vital signs reviewed and stable Respiratory status: spontaneous breathing, nonlabored ventilation and respiratory function stable Cardiovascular status: blood pressure returned to baseline and stable Postop Assessment: no apparent nausea or vomiting Anesthetic complications: no   No notable events documented.  Last Vitals:  Vitals:   12/25/24 1345 12/25/24 1400  BP: (!) 121/93 (!) 114/93  Pulse: 74 74  Resp: 20 20  Temp:  36.6 C  SpO2: 100% 100%    Last Pain:  Vitals:   12/25/24 1330  TempSrc:   PainSc: 0-No pain                 Garnette FORBES Skillern      "

## 2024-12-25 NOTE — Consult Note (Signed)
 "    Glenys Goldmann 2001/04/06  [id number].    Requesting MD: Tonia Chew MD Chief Complaint/Reason for Consult: symptomatic cholelithiasis   HPI:  Madison Foster is a 24 y/o F with no reported PMH who presents with worsening RUQ pain. States that 4-5 months ago she developed her first episode of pain. Pain is post-prandial and occasionally radiates around her right side. Associated symptoms include nausea, vomiting, decreased PO intake. States the pain has been increasing in frequency and severity and now she is unable to drink Gatorade without vomiting. Denies epigastric pain or history of gastritis/ulcers. Denies fevers/chills. Reports decreased frequency of stool which she attributes to decreased PO intake. Denies melena or hematochezia. Denies a previous history of surgery, She reports smoking/vaping and occasional marijuana use. Currently employed doing landscaping work, previously working in stage manager. Her mother is at the bedside.   ROS: Review of Systems  All other systems reviewed and are negative.   History reviewed. No pertinent family history.  Past Medical History:  Diagnosis Date   Seasonal allergies     History reviewed. No pertinent surgical history.  Social History:  reports that she does not drink alcohol and does not use drugs. No history on file for tobacco use.  Allergies: Allergies[1]  (Not in a hospital admission)    Physical Exam: Blood pressure 121/66, pulse 64, temperature 98.7 F (37.1 C), temperature source Oral, resp. rate 16, height 5' 7 (1.702 m), weight 67.9 kg, SpO2 100%. General: Pleasant black female, laying on hospital bed, appears stated age, NAD. HEENT: head -normocephalic, atraumatic; Eyes: PERRLA, no conjunctival injection; anicteric sclerae Neck- Trachea is midline CV- RRR, no lower extremity edema  Pulm- breathing is non-labored ORA Abd- soft, non-distended, mild RUQ tenderness (just received morphine), no rebound or guarding,  no hernias or masses GU- deferred  MSK- UE/LE symmetrical, no cyanosis, clubbing, or edema. Neuro- CN II-XII grossly in tact, no paresthesias. Psych- Alert and Oriented x3 with appropriate affect Skin: warm and dry, no rashes or lesions   Results for orders placed or performed during the hospital encounter of 12/25/24 (from the past 48 hours)  Lipase, blood     Status: None   Collection Time: 12/25/24  3:56 AM  Result Value Ref Range   Lipase 30 11 - 51 U/L    Comment: Performed at [facility], [address] [place], [place] [address]  Comprehensive metabolic panel     Status: Abnormal   Collection Time: 12/25/24  3:56 AM  Result Value Ref Range   Sodium 138 135 - 145 mmol/L   Potassium 4.1 3.5 - 5.1 mmol/L   Chloride 106 98 - 111 mmol/L   CO2 21 (L) 22 - 32 mmol/L   Glucose, Bld 94 70 - 99 mg/dL    Comment: Glucose reference range applies only to samples taken after fasting for at least 8 hours.   BUN 11 6 - 20 mg/dL   Creatinine, Ser 9.09 0.44 - 1.00 mg/dL   Calcium 9.4 8.9 - 89.6 mg/dL   Total Protein 7.4 6.5 - 8.1 g/dL   Albumin 4.4 3.5 - 5.0 g/dL   AST 22 15 - 41 U/L   ALT 15 0 - 44 U/L   Alkaline Phosphatase 46 38 - 126 U/L   Total Bilirubin 0.3 0.0 - 1.2 mg/dL   GFR, Estimated >39 >39 mL/min    Comment: (NOTE) Calculated using the CKD-EPI Creatinine Equation (2021)    Anion gap 11 5 - 15  Comment: Performed at [facility], [address] [place], [place] [address]  CBC     Status: Abnormal   Collection Time: 12/25/24  3:56 AM  Result Value Ref Range   WBC 5.1 4.0 - 10.5 K/uL   RBC 4.64 3.87 - 5.11 MIL/uL   Hemoglobin 11.2 (L) 12.0 - 15.0 g/dL   HCT 65.4 (L) 63.9 - 53.9 %   MCV 74.4 (L) 80.0 - 100.0 fL   MCH 24.1 (L) 26.0 - 34.0 pg   MCHC 32.5 30.0 - 36.0 g/dL   RDW 85.4 88.4 - 84.4 %   Platelets 345 150 - 400 K/uL   nRBC 0.0 0.0 - 0.2 %    Comment: Performed at [facility], [address], [place], [place] [address]  hCG, serum,  qualitative     Status: None   Collection Time: 12/25/24  3:56 AM  Result Value Ref Range   Preg, Serum NEGATIVE NEGATIVE    Comment:        THE SENSITIVITY OF THIS METHODOLOGY IS >10 mIU/mL. Performed at [facility], [address] [place], [place] [address]   Urinalysis, Routine w reflex microscopic -Urine, Clean Catch     Status: Abnormal   Collection Time: 12/25/24  3:58 AM  Result Value Ref Range   Color, Urine YELLOW YELLOW   APPearance HAZY (A) CLEAR   Specific Gravity, Urine 1.024 1.005 - 1.030   pH 5.0 5.0 - 8.0   Glucose, UA NEGATIVE NEGATIVE mg/dL   Hgb urine dipstick MODERATE (A) NEGATIVE   Bilirubin Urine NEGATIVE NEGATIVE   Ketones, ur NEGATIVE NEGATIVE mg/dL   Protein, ur NEGATIVE NEGATIVE mg/dL   Nitrite NEGATIVE NEGATIVE   Leukocytes,Ua TRACE (A) NEGATIVE   RBC / HPF 0-5 0 - 5 RBC/hpf   WBC, UA 0-5 0 - 5 WBC/hpf   Bacteria, UA RARE (A) NONE SEEN   Squamous Epithelial / HPF 0-5 0 - 5 /HPF   Mucus PRESENT     Comment: Performed at [facility], [address], [place], [place] [address]   US  Abdomen Limited RUQ (LIVER/GB) Result Date: 12/25/2024 EXAM: Right Upper Quadrant Abdominal Ultrasound 12/25/2024 04:34:00 AM TECHNIQUE: Real-time ultrasonography of the right upper quadrant of the abdomen was performed. COMPARISON: None available. CLINICAL HISTORY: Right upper quadrant pain. FINDINGS: LIVER: Normal echogenicity. No intrahepatic biliary ductal dilatation. No evidence of mass. Hepatopetal flow in the portal vein. BILIARY SYSTEM: Gallbladder wall thickness measures 0.3 cm. There is a 10 mm calculus within the fullness of the gallbladder. There is no evidence of cholecystitis. No pericholecystic fluid. Common bile duct is within normal limits measuring 0.3 cm. RIGHT KIDNEY: No hydronephrosis. No echogenic calculi. No mass. OTHER: No right upper quadrant ascites. IMPRESSION: 1. 10 mm calculus within the gallbladder. No sonographic evidence of acute  cholecystitis. Electronically signed by: Evalene Coho MD 12/25/2024 05:14 AM EST RP Workstation: [id number]      Assessment/Plan Symptomatic cholelithiasis, possible acute cholecystitis Patient history, labs, and imaging have all been personally reviewed. Her history is consistent with worsening biliary colic and possible early acute cholecystitis. WBC 5.1. LFTs are unremarkable. RUQ U/S shows cholelithiasis (10 mm) without wall thickening or peri cholecystitic fluid. Normal caliber CBD.   The operative and non-operative management of symptomatic cholelithiasis was discussed with the patient. Risks of surgery including bleeding, infection, damage to surrounding structures, conversion to open, drain placement, need for additional procedures, prolonged hospital stay, as well as the risks  of general anesthesia were discussed with the patient and she would like to proceed with surgery. Questions were welcomed and answered.   FEN - NPO. IVF VTE - SCD's ID - Rocephin Admit - plan to discharge from PACU pending outcome of surgery  I reviewed nursing notes, ED provider notes, last 24 h vitals and pain scores, last 48 h intake and output, last 24 h labs and trends, and last 24 h imaging results.  Almarie GORMAN Pringle, PA-C [facility] 12/25/2024, 9:48 AM Please see Amion for pager number during day hours 7:00am-4:30pm or 7:00am -11:30am on weekends     [1]  Allergies Allergen Reactions   Shellfish Allergy Anaphylaxis   Other Rash and Other (See Comments)    Chex Mix   "

## 2024-12-25 NOTE — Transfer of Care (Signed)
 Immediate Anesthesia Transfer of Care Note  Patient: Madison Foster  Procedure(s) Performed: LAPAROSCOPIC CHOLECYSTECTOMY (Abdomen) INDOCYANINE GREEN FLUORESCENCE IMAGING (ICG) (Abdomen)  Patient Location: PACU  Anesthesia Type:General  Level of Consciousness: drowsy  Airway & Oxygen Therapy: Patient Spontanous Breathing and Patient connected to face mask oxygen  Post-op Assessment: Report given to RN, Post -op Vital signs reviewed and stable, and Patient moving all extremities  Post vital signs: Reviewed and stable  Last Vitals:  Vitals Value Taken Time  BP 127/82 12/25/24 12:38  Temp 36.6 C 12/25/24 12:38  Pulse 91 12/25/24 12:43  Resp 29 12/25/24 12:43  SpO2 100 % 12/25/24 12:43  Vitals shown include unfiled device data.  Last Pain:  Vitals:   12/25/24 1238  TempSrc:   PainSc: 0-No pain      Patients Stated Pain Goal: 3 (12/25/24 0816)  Complications: No notable events documented.

## 2024-12-25 NOTE — ED Triage Notes (Signed)
 C/O right upper abdominal pain starting 12/24/24.   Reports pain worse after eating dinner at 2300.   Reports vomiting multiple times today.   Denies fever

## 2024-12-25 NOTE — Anesthesia Procedure Notes (Signed)
 Procedure Name: Intubation Date/Time: 12/25/2024 11:43 AM  Performed by: Erlene Powell POUR, CRNAPre-anesthesia Checklist: Patient identified, Emergency Drugs available, Suction available and Patient being monitored Patient Re-evaluated:Patient Re-evaluated prior to induction Oxygen Delivery Method: Circle System Utilized Preoxygenation: Pre-oxygenation with 100% oxygen Induction Type: IV induction and Rapid sequence Laryngoscope Size: Mac and 4 Grade View: Grade I Tube type: Oral Tube size: 7.0 mm Number of attempts: 1 Airway Equipment and Method: Stylet Placement Confirmation: ETT inserted through vocal cords under direct vision, positive ETCO2 and breath sounds checked- equal and bilateral Secured at: 21 cm Tube secured with: Tape Dental Injury: Teeth and Oropharynx as per pre-operative assessment

## 2024-12-25 NOTE — Discharge Instructions (Signed)
 You were hospitalized for because your gallbladder was inflamed. We got your gallbladder taken out Thank you for allowing us  to be part of your care.   Your appointment is with Dr. Azadegan on 12/19 at 10:15 am. Please go to this appointment  Please note these changes made to your medications:  *Please START taking:  We are starting you on jardiance  10 mg. Please start taking this tomorrow(12/14) and take only one tablet a day   We are also giving you some medication that you can take over the next couple of days for pain. Please take this for severe pain. Make sure you are still having regular bowel movements with it.   Please call our clinic if you have any questions or concerns, we may be able to help and keep you from a long and expensive emergency room wait. Our clinic and after hours phone number is [phone], the best time to call is Monday through Friday 9 am to 4 pm but there is always someone available 24/7 if you have an emergency. If you need medication refills please notify your pharmacy one week in advance and they will send us  a request.      LAPAROSCOPIC SURGERY: POST OP INSTRUCTIONS Always review your discharge instruction sheet given to you by the facility where your surgery was performed. IF YOU HAVE DISABILITY OR FAMILY LEAVE FORMS, YOU MUST BRING THEM TO THE OFFICE FOR PROCESSING.   DO NOT GIVE THEM TO YOUR DOCTOR.  PAIN CONTROL  First take acetaminophen  (Tylenol ) AND/or ibuprofen (Advil) to control your pain after surgery.  Follow directions on package.  Taking acetaminophen  (Tylenol ) and/or ibuprofen (Advil) regularly after surgery will help to control your pain and lower the amount of prescription pain medication you may need.  You should not take more than 3,000 mg (3 grams) of acetaminophen  (Tylenol ) in 24 hours.  You should not take ibuprofen (Advil), aleve, motrin, naprosyn or other NSAIDS if you have a history of stomach ulcers or chronic kidney disease.  A  prescription for pain medication may be given to you upon discharge.  Take your pain medication as prescribed, if you still have uncontrolled pain after taking acetaminophen  (Tylenol ) or ibuprofen (Advil). Use ice packs to help control pain. If you need a refill on your pain medication, please contact your pharmacy.  They will contact our office to request authorization. Prescriptions will not be filled after 5pm or on week-ends.  HOME MEDICATIONS Take your usually prescribed medications unless otherwise directed.  DIET You should follow a light diet the first few days after arrival home.  Be sure to include lots of fluids daily. Avoid fatty, fried foods.   CONSTIPATION It is common to experience some constipation after surgery and if you are taking pain medication.  Increasing fluid intake and taking a stool softener (such as Colace) will usually help or prevent this problem from occurring.  A mild laxative (Milk of Magnesia or Miralax ) should be taken according to package instructions if there are no bowel movements after 48 hours.  WOUND/INCISION CARE Most patients will experience some swelling and bruising in the area of the incisions.  Ice packs will help.  Swelling and bruising can take several days to resolve.  Unless discharge instructions indicate otherwise, follow guidelines below  STERI-STRIPS - you may remove your outer bandages 48 hours after surgery, and you may shower at that time.  You have steri-strips (small skin tapes) in place directly over the incision.  These strips should  be left on the skin for 7-10 days.   DERMABOND/SKIN GLUE - you may shower in 24 hours.  The glue will flake off over the next 2-3 weeks. Any sutures or staples will be removed at the office during your follow-up visit.  ACTIVITIES You may resume regular (light) daily activities beginning the next day--such as daily self-care, walking, climbing stairs--gradually increasing activities as tolerated.  You may  have sexual intercourse when it is comfortable.  Refrain from any heavy lifting or straining until approved by your doctor. You may drive when you are no longer taking prescription pain medication, you can comfortably wear a seatbelt, and you can safely maneuver your car and apply brakes.  FOLLOW-UP You should see your doctor in the office for a follow-up appointment approximately 2-3 weeks after your surgery.  You should have been given your post-op/follow-up appointment when your surgery was scheduled.  If you did not receive a post-op/follow-up appointment, make sure that you call for this appointment within a day or two after you arrive home to insure a convenient appointment time.  OTHER INSTRUCTIONS  WHEN TO CALL YOUR DOCTOR: Fever over 101.0 Inability to urinate Continued bleeding from incision. Increased pain, redness, or drainage from the incision. Increasing abdominal pain  The clinic staff is available to answer your questions during regular business hours.  Please dont hesitate to call and ask to speak to one of the nurses for clinical concerns.  If you have a medical emergency, go to the nearest emergency room or call 911.  A surgeon from [facility] is always on call at the hospital. [address], [address], [place], [place]  [address] ? [address] [address], [place], [place]   [address] [phone] ? [phone] ? FAX [phone] Web site: [email or web address]

## 2024-12-25 NOTE — Op Note (Signed)
" °  12/25/2024  12:30 PM  PATIENT:  Madison Foster  23 y.o. female  PRE-OPERATIVE DIAGNOSIS:  CHOLECYSTITIS  POST-OPERATIVE DIAGNOSIS:  CHOLECYSTITIS  PROCEDURE:  Procedures: LAPAROSCOPIC CHOLECYSTECTOMY INDOCYANINE GREEN FLUORESCENCE IMAGING (ICG)  SURGEON:  Surgeon(s): Sebastian Moles, MD  ASSISTANTS: none   ANESTHESIA:   local and general  EBL:  Total I/O In: 100 [IV Piggyback:100] Out: -   BLOOD ADMINISTERED:none  DRAINS: none   SPECIMEN:  Excision  DISPOSITION OF SPECIMEN:  PATHOLOGY  COUNTS:  YES  DICTATION: .Dragon Dictation Findings: Acute cholecystitis  Procedure in detail: Informed consent was obtained.  She received intravenous antibiotics.  She received ICG dye.  She was brought to the operating room and general endotracheal anesthesia was administered by the anesthesia staff.  Her abdomen was prepped and draped in a sterile fashion.  Timeout procedure was performed.The infraumbilical region was infiltrated with local. Infraumbilical incision was made. Subcutaneous tissues were dissected down revealing the anterior fascia. This was divided sharply along the midline. Peritoneal cavity was entered under direct vision without complication. A 0 Vicryl pursestring was placed around the fascial opening. Hassan trocar was inserted into the abdomen. The abdomen was insufflated with carbon dioxide in standard fashion. Under direct vision a 5 mm epigastric and 5 mm right abdominal port x 2 were placed.  Local was used at each port site.  Laparoscopic exploration revealed an acutely inflamed gallbladder with some omental adhesions to the body and infundibulum.  These adhesions were gently taken down with cautery.  The dome was then able to be retracted superior and medially.  The infundibulum was retracted inferiorly and laterally.  Dissection began laterally and progressed medially identifying the cystic duct.  We also used the ICG dye to clearly visualize the common duct and  cystic duct junction.  The cystic duct was dissected until we had a critical view of safety.  3 clips were placed proximally and 1 was placed distally.  It was divided.  Further dissection revealed the cystic artery.  This was clipped twice proximally and once distally and then divided.  The gallbladder was taken off the liver bed using Bovie cautery.  It was placed in a bag and removed from the abdomen.  The liver bed was cauterized to get excellent hemostasis.  All the clips remained in good position.  The liver bed was dry.  The area was irrigated and the irrigation fluid was evacuated.  Four-quadrant inspection revealed no other complicating features.  Ports were removed under direct vision.  Pneumoperitoneum was released.  The infraumbilical fascia was closed by tying the pursestring.  All 4 wounds were irrigated and closed with 4-0 Vicryl followed by Dermabond.  All counts were correct.  She tolerated the procedure well without apparent complication was taken recovery in stable condition.  PATIENT DISPOSITION:  PACU - hemodynamically stable.   Delay start of Pharmacological VTE agent (>24hrs) due to surgical blood loss or risk of bleeding:  no  Moles Sebastian, MD, MPH, FACS Pager: [phone]  1/2/202612:30 PM              "

## 2024-12-25 NOTE — ED Notes (Signed)
 Gave report to Gretta,RN pt going to short stay [facility].

## 2024-12-25 NOTE — ED Provider Notes (Signed)
 "  EMERGENCY DEPARTMENT AT [facility] Provider Note   CSN: [id number] Arrival date & time: 12/25/24  9663     Patient presents with: Abdominal Pain   Madison Foster is a 24 y.o. female.   Patient presented with nausea vomiting pain after eating.  Patient had multiple episodes over the past few weeks almost every meal now.  No known gallstone history.  No fevers or chills.  No abdominal surgery history.  No urinary symptoms.  The history is provided by the patient.  Abdominal Pain Associated symptoms: vomiting   Associated symptoms: no chest pain, no chills, no dysuria, no fever and no shortness of breath        Prior to Admission medications  Medication Sig Start Date End Date Taking? Authorizing Provider  acetaminophen  (TYLENOL ) 500 MG tablet Take 500-1,000 mg by mouth every 8 (eight) hours as needed for mild pain (or headaches).     [provider]  benzonatate  (TESSALON ) 100 MG capsule Take 1 capsule (100 mg total) by mouth 3 (three) times daily as needed for cough. 01/06/24   Keith Sor, PA-C  dicyclomine  (BENTYL ) 20 MG tablet Take 1 tablet (20 mg total) by mouth 2 (two) times daily. 05/02/22   Vonn Hadassah LABOR, PA-C  hydrOXYzine  (ATARAX ) 25 MG tablet Take 1 tablet (25 mg total) by mouth every 6 (six) hours as needed for anxiety. 06/06/24   Long, Fonda MATSU, MD  ibuprofen  (ADVIL ) 400 MG tablet Take 1 tablet (400 mg total) by mouth every 8 (eight) hours as needed for fever, headache, mild pain, moderate pain or cramping. Patient not taking: Reported on 11/14/2020 06/01/19   Haskins, Kaila R, NP  olopatadine  (PATANOL) 0.1 % ophthalmic solution Place 1 drop into both eyes 2 (two) times daily. Take until symptoms resolve. Patient not taking: Reported on 11/14/2020 05/14/16   Lynette Norris, MD  ondansetron  (ZOFRAN -ODT) 4 MG disintegrating tablet Take 1 tablet (4 mg total) by mouth every 8 (eight) hours as needed for nausea or vomiting. 01/06/24   Keith Sor,  PA-C    Allergies: Shellfish allergy and Other    Review of Systems  Constitutional:  Negative for chills and fever.  HENT:  Negative for congestion.   Eyes:  Negative for visual disturbance.  Respiratory:  Negative for shortness of breath.   Cardiovascular:  Negative for chest pain.  Gastrointestinal:  Positive for abdominal pain and vomiting.  Genitourinary:  Negative for dysuria and flank pain.  Musculoskeletal:  Negative for back pain, neck pain and neck stiffness.  Skin:  Negative for rash.  Neurological:  Negative for light-headedness and headaches.    Updated Vital Signs BP 121/66 Comment: Map: 84  Pulse 64   Temp 98.7 F (37.1 C) (Oral)   Resp 16   Ht 5' 7 (1.702 m)   Wt 67.9 kg   SpO2 100%   BMI 23.45 kg/m   Physical Exam Vitals and nursing note reviewed.  Constitutional:      General: She is not in acute distress.    Appearance: She is well-developed.  HENT:     Head: Normocephalic and atraumatic.     Comments: Dry mucous membranes    Mouth/Throat:     Mouth: Mucous membranes are moist.  Eyes:     General:        Right eye: No discharge.        Left eye: No discharge.     Conjunctiva/sclera: Conjunctivae normal.  Neck:  Trachea: No tracheal deviation.  Cardiovascular:     Rate and Rhythm: Normal rate and regular rhythm.  Pulmonary:     Effort: Pulmonary effort is normal.     Breath sounds: Normal breath sounds.  Abdominal:     General: There is no distension.     Palpations: Abdomen is soft.     Tenderness: There is abdominal tenderness in the right upper quadrant. There is no guarding.  Musculoskeletal:     Cervical back: Normal range of motion and neck supple. No rigidity.  Skin:    General: Skin is warm.     Capillary Refill: Capillary refill takes less than 2 seconds.     Findings: No rash.  Neurological:     General: No focal deficit present.     Mental Status: She is alert.     Cranial Nerves: No cranial nerve deficit.   Psychiatric:        Mood and Affect: Mood normal.     (all labs ordered are listed, but only abnormal results are displayed) Labs Reviewed  COMPREHENSIVE METABOLIC PANEL WITH GFR - Abnormal; Notable for the following components:      Result Value   CO2 21 (*)    All other components within normal limits  CBC - Abnormal; Notable for the following components:   Hemoglobin 11.2 (*)    HCT 34.5 (*)    MCV 74.4 (*)    MCH 24.1 (*)    All other components within normal limits  URINALYSIS, ROUTINE W REFLEX MICROSCOPIC - Abnormal; Notable for the following components:   APPearance HAZY (*)    Hgb urine dipstick MODERATE (*)    Leukocytes,Ua TRACE (*)    Bacteria, UA RARE (*)    All other components within normal limits  LIPASE, BLOOD  HCG, SERUM, QUALITATIVE    EKG: None  Radiology: US  Abdomen Limited RUQ (LIVER/GB) Result Date: 12/25/2024 EXAM: Right Upper Quadrant Abdominal Ultrasound 12/25/2024 04:34:00 AM TECHNIQUE: Real-time ultrasonography of the right upper quadrant of the abdomen was performed. COMPARISON: None available. CLINICAL HISTORY: Right upper quadrant pain. FINDINGS: LIVER: Normal echogenicity. No intrahepatic biliary ductal dilatation. No evidence of mass. Hepatopetal flow in the portal vein. BILIARY SYSTEM: Gallbladder wall thickness measures 0.3 cm. There is a 10 mm calculus within the fullness of the gallbladder. There is no evidence of cholecystitis. No pericholecystic fluid. Common bile duct is within normal limits measuring 0.3 cm. RIGHT KIDNEY: No hydronephrosis. No echogenic calculi. No mass. OTHER: No right upper quadrant ascites. IMPRESSION: 1. 10 mm calculus within the gallbladder. No sonographic evidence of acute cholecystitis. Electronically signed by: Evalene Coho MD 12/25/2024 05:14 AM EST RP Workstation: [id number]     Procedures   Medications Ordered in the ED  ondansetron  (ZOFRAN -ODT) disintegrating tablet 4 mg (4 mg Oral Given 12/25/24 0354)   fentaNYL (SUBLIMAZE) injection 50 mcg (50 mcg Intravenous Given 12/25/24 0457)  ondansetron  (ZOFRAN ) injection 4 mg (4 mg Intravenous Given 12/25/24 0456)  morphine (PF) 4 MG/ML injection 4 mg (4 mg Intravenous Given 12/25/24 0904)  sodium chloride  0.9 % bolus 1,000 mL (1,000 mLs Intravenous New Bag/Given 12/25/24 0853)  ondansetron  (ZOFRAN ) injection 4 mg (4 mg Intravenous Given 12/25/24 0902)                                    Medical Decision Making Amount and/or Complexity of Data Reviewed Labs: ordered.  Risk Prescription drug  management.   Patient presents with recurrent upper abdominal discomfort differential includes acute cholelithiasis, acute gastritis, ulcer, reflux, hepatitis, other.  Blood work independently reviewed overall reassuring normal white count normal hemoglobin electrolytes unremarkable, kidney function at baseline.  Ultrasound independently reviewed showing single 1 cm gallstone.  Patient's pain and nausea worsened requiring repeat medications.  Morphine, IV fluids and Zofran  given.  Discussed with patient and her mom in the room and patient gets symptoms after every meal now.  Discussed with general surgery on-call who will see the patient with goal of removing gallbladder later today if schedule permits.     Final diagnoses:  Recurrent vomiting  Symptomatic cholelithiasis    ED Discharge Orders     None          Tonia Chew, MD 12/25/24 [phone]  "

## 2024-12-25 NOTE — Anesthesia Preprocedure Evaluation (Addendum)
"                                    Anesthesia Evaluation  Patient identified by MRN, date of birth, ID band Patient awake    Reviewed: Allergy & Precautions, NPO status , Patient's Chart, lab work & pertinent test results  Airway Mallampati: II  TM Distance: >3 FB Neck ROM: Full    Dental  (+) Teeth Intact, Dental Advisory Given   Pulmonary neg pulmonary ROS   Pulmonary exam normal breath sounds clear to auscultation       Cardiovascular negative cardio ROS Normal cardiovascular exam Rhythm:Regular Rate:Normal     Neuro/Psych negative neurological ROS     GI/Hepatic negative GI ROS,,,CHOLECYSTITIS   Endo/Other  negative endocrine ROS    Renal/GU negative Renal ROS     Musculoskeletal negative musculoskeletal ROS (+)    Abdominal   Peds  Hematology  (+) Blood dyscrasia, anemia   Anesthesia Other Findings Day of surgery medications reviewed with the patient.  Reproductive/Obstetrics negative OB ROS                              Anesthesia Physical Anesthesia Plan  ASA: 2  Anesthesia Plan: General   Post-op Pain Management:    Induction: Intravenous  PONV Risk Score and Plan: 4 or greater and Scopolamine patch - Pre-op, Midazolam, Dexamethasone, Ondansetron  and TIVA  Airway Management Planned: Oral ETT  Additional Equipment:   Intra-op Plan:   Post-operative Plan: Extubation in OR  Informed Consent: I have reviewed the patients History and Physical, chart, labs and discussed the procedure including the risks, benefits and alternatives for the proposed anesthesia with the patient or authorized representative who has indicated his/her understanding and acceptance.     Dental advisory given  Plan Discussed with: CRNA  Anesthesia Plan Comments:          Anesthesia Quick Evaluation  "

## 2024-12-26 ENCOUNTER — Encounter (HOSPITAL_COMMUNITY): Payer: Self-pay | Admitting: General Surgery

## 2024-12-28 LAB — SURGICAL PATHOLOGY
